# Patient Record
Sex: Male | Born: 1980
Health system: Southern US, Community
[De-identification: ages and names within clinical notes are randomized; demographics above are authoritative.]

## PROBLEM LIST (undated history)

## (undated) DIAGNOSIS — R42 Dizziness and giddiness: Secondary | ICD-10-CM

## (undated) DIAGNOSIS — K219 Gastro-esophageal reflux disease without esophagitis: Secondary | ICD-10-CM

---

## 2014-02-21 ENCOUNTER — Encounter (HOSPITAL_COMMUNITY): Payer: Self-pay | Admitting: Family Medicine

## 2014-02-21 ENCOUNTER — Emergency Department (INDEPENDENT_AMBULATORY_CARE_PROVIDER_SITE_OTHER)
Admission: EM | Admit: 2014-02-21 | Discharge: 2014-02-21 | Disposition: A | Discharge status: Home / Self Care | Payer: Worker's Compensation | Source: Home / Self Care | Attending: Family Medicine | Admitting: Family Medicine

## 2014-02-21 DIAGNOSIS — S0191XA Laceration without foreign body of unspecified part of head, initial encounter: Secondary | ICD-10-CM

## 2014-02-21 DIAGNOSIS — Y9269 Other specified industrial and construction area as the place of occurrence of the external cause: Secondary | ICD-10-CM

## 2014-02-21 DIAGNOSIS — S0190XA Unspecified open wound of unspecified part of head, initial encounter: Secondary | ICD-10-CM

## 2014-02-21 DIAGNOSIS — IMO0002 Reserved for concepts with insufficient information to code with codable children: Secondary | ICD-10-CM

## 2014-02-21 NOTE — ED Provider Notes (Signed)
CSN: [id number]     Arrival date & time 02/21/14  1825 History   First MD Initiated Contact with Patient 02/21/14 1928     No chief complaint on file.  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 Immediately painful and bleeding. Applied ointment from first aid kit and stopped the bleeding. Last tetanus in 2010. Sensation still intact. Denies dizziness, HA, syncope, lightheadedness.   History reviewed. No pertinent past medical history. History reviewed. No pertinent past surgical history. Family History  Problem Relation Age of Onset  . Cancer Mother     colon   History  Substance Use Topics  . Smoking status: Never Smoker   . Smokeless tobacco: Not on file  . Alcohol Use: No    Review of Systems Per HPI with all other pertinent systems negative.   Allergies  Review of patient's allergies indicates not on file.  Home Medications   Prior to Admission medications   Not on File   BP 123/75  Pulse 51  Temp(Src) 98 F (36.7 C) (Oral)  Resp 16  SpO2 98% Physical Exam  Constitutional: He is oriented to person, place, and time. He appears well-developed and well-nourished.  HENT:  Head: Normocephalic.  Mouth/Throat: Oropharynx is clear and moist.  Eyes: Pupils are equal, round, and reactive to light.  Neck: Normal range of motion.  Pulmonary/Chest: Effort normal. No respiratory distress.  Musculoskeletal: Normal range of motion. He exhibits no edema and no tenderness.  Neurological: He is alert and oriented to person, place, and time. No cranial nerve deficit. He exhibits normal muscle tone. Coordination normal.  Skin: Skin is warm and dry. No rash noted. No erythema. No pallor.  3.75cm linear epidermal abrasion. No active bleeding. No surounding induration or ttp. No contusion    Psychiatric: He has a normal mood and affect. His behavior is normal. Judgment and thought content normal.    ED Course  Procedures (including critical care time) Labs Review Labs Reviewed - No data to display  Imaging Review No results found.   MDM   1. Laceration of head, initial encounter    Superficial laceration or skin abrasion. No need for suturing or other repair.  Bacitracin ointment BID  Until scab forms No sign of intracranial injury or concussion Tetanus is UTD Precautions given and all questions answered  Linna Darner, MD Family Medicine 02/21/2014, 7:52 PM      Waldemar Dickens, MD 02/21/14 [phone]

## 2014-02-21 NOTE — Discharge Instructions (Signed)
You have a superficial laceration of your scalp that measures 3.75 cm. This will heal quickly Please apply bacitracin ointment to your head twice a day until a scab forms Please come back if you develop any symptoms of infection

## 2014-06-10 ENCOUNTER — Emergency Department (HOSPITAL_COMMUNITY): Payer: Self-pay

## 2014-06-10 ENCOUNTER — Emergency Department (HOSPITAL_COMMUNITY)
Admission: EM | Admit: 2014-06-10 | Discharge: 2014-06-10 | Disposition: A | Discharge status: Home / Self Care | Payer: Self-pay | Attending: Emergency Medicine | Admitting: Emergency Medicine

## 2014-06-10 ENCOUNTER — Encounter (HOSPITAL_COMMUNITY): Payer: Self-pay | Admitting: Emergency Medicine

## 2014-06-10 DIAGNOSIS — R630 Anorexia: Secondary | ICD-10-CM | POA: Insufficient documentation

## 2014-06-10 DIAGNOSIS — R109 Unspecified abdominal pain: Secondary | ICD-10-CM | POA: Insufficient documentation

## 2014-06-10 DIAGNOSIS — Z79899 Other long term (current) drug therapy: Secondary | ICD-10-CM | POA: Insufficient documentation

## 2014-06-10 DIAGNOSIS — R5383 Other fatigue: Secondary | ICD-10-CM

## 2014-06-10 DIAGNOSIS — R5381 Other malaise: Secondary | ICD-10-CM | POA: Insufficient documentation

## 2014-06-10 LAB — CBC WITH DIFFERENTIAL/PLATELET
BASOS ABS: 0 10*3/uL (ref 0.0–0.1)
BASOS PCT: 0 % (ref 0–1)
EOS ABS: 0.1 10*3/uL (ref 0.0–0.7)
Eosinophils Relative: 1 % (ref 0–5)
HEMATOCRIT: 38.9 % — AB (ref 39.0–52.0)
Hemoglobin: 14.1 g/dL (ref 13.0–17.0)
Lymphocytes Relative: 32 % (ref 12–46)
Lymphs Abs: 3.2 10*3/uL (ref 0.7–4.0)
MCH: 29.5 pg (ref 26.0–34.0)
MCHC: 36.2 g/dL — AB (ref 30.0–36.0)
MCV: 81.4 fL (ref 78.0–100.0)
MONO ABS: 0.6 10*3/uL (ref 0.1–1.0)
Monocytes Relative: 6 % (ref 3–12)
Neutro Abs: 6.1 10*3/uL (ref 1.7–7.7)
Neutrophils Relative %: 61 % (ref 43–77)
Platelets: 298 10*3/uL (ref 150–400)
RBC: 4.78 MIL/uL (ref 4.22–5.81)
RDW: 16.1 % — AB (ref 11.5–15.5)
WBC: 10.1 10*3/uL (ref 4.0–10.5)

## 2014-06-10 LAB — COMPREHENSIVE METABOLIC PANEL
ALBUMIN: 4.2 g/dL (ref 3.5–5.2)
ALT: 24 U/L (ref 0–53)
ANION GAP: 13 (ref 5–15)
AST: 28 U/L (ref 0–37)
Alkaline Phosphatase: 57 U/L (ref 39–117)
BILIRUBIN TOTAL: 0.9 mg/dL (ref 0.3–1.2)
BUN: 14 mg/dL (ref 6–23)
CALCIUM: 9.9 mg/dL (ref 8.4–10.5)
CO2: 25 mEq/L (ref 19–32)
CREATININE: 0.9 mg/dL (ref 0.50–1.35)
Chloride: 100 mEq/L (ref 96–112)
GFR calc Af Amer: >90 mL/min (ref >90)
GFR calc non Af Amer: >90 mL/min (ref >90)
Glucose, Bld: 131 mg/dL — ABNORMAL HIGH (ref 70–99)
Potassium: 4.1 mEq/L (ref 3.7–5.3)
Sodium: 138 mEq/L (ref 137–147)
TOTAL PROTEIN: 8.2 g/dL (ref 6.0–8.3)

## 2014-06-10 LAB — URINALYSIS, ROUTINE W REFLEX MICROSCOPIC
Bilirubin Urine: NEGATIVE
Glucose, UA: NEGATIVE mg/dL
HGB URINE DIPSTICK: NEGATIVE
Ketones, ur: NEGATIVE mg/dL
Leukocytes, UA: NEGATIVE
Nitrite: NEGATIVE
Protein, ur: NEGATIVE mg/dL
Specific Gravity, Urine: 1.013 (ref 1.005–1.030)
UROBILINOGEN UA: 0.2 mg/dL (ref 0.0–1.0)
pH: 6.5 (ref 5.0–8.0)

## 2014-06-10 LAB — CBG MONITORING, ED: Glucose-Capillary: 146 mg/dL — ABNORMAL HIGH (ref 70–99)

## 2014-06-10 LAB — LIPASE, BLOOD: Lipase: 55 U/L (ref 11–59)

## 2014-06-10 MED ORDER — ACETAMINOPHEN 500 MG PO TABS: 500.0000 mg | ORAL_TABLET | Freq: Four times a day (QID) | ORAL | Status: DC | PRN

## 2014-06-10 NOTE — ED Provider Notes (Signed)
CSN: [id number]     Arrival date & time 06/10/14  1631 History   First MD Initiated Contact with Patient 06/10/14 1714     Chief Complaint  Patient presents with  . Flank Pain    left  . Abdominal Pain  . Fatigue     (Consider location/radiation/quality/duration/timing/severity/associated sxs/prior Treatment) HPI Patient has a constellation of symptoms that started about a month ago. He reports generalized fatigue. He denies fever. He reports decreased appetite. He denies abdominal pain. He does however note that there is a spot on the left side of his abdomen that he sometimes feels protrudes somewhat. He also notes that sometimes he has pain in the left flank that he qualifies as being sharp. It comes and goes. It is not currently present. The patient reports that sometimes after having a bowel movement he wipes and he notes some red blood on the tissue. He denies he's having diarrhea or constipation. The patient does not have vomiting. He reports sometimes he gets a sharp pain up behind his right shoulder blade. That may come and go. He does not have associated shortness of breath or cough. Is not present currently. He reports that he is often very thirsty and that he thinks he is urinating more than usual although does not have any pain or burning with urination. Patient denies skin rash. He denies joint pain or swelling. No headache no incoordination no visual changes. History reviewed. No pertinent past medical history. History reviewed. No pertinent past surgical history. Family History  Problem Relation Age of Onset  . Cancer Mother     colon   History  Substance Use Topics  . Smoking status: Never Smoker   . Smokeless tobacco: Not on file  . Alcohol Use: No    Review of Systems 10 Systems reviewed and are negative for acute change except as noted in the HPI.    Allergies  Review of patient's allergies indicates no known allergies.  Home Medications   Prior to Admission  medications   Medication Sig Start Date End Date Taking? Authorizing Provider  NON FORMULARY Take 574 mg by mouth 2 (two) times daily. Amoxicillin [place].   Yes Historical Provider, MD  Omega-3 Fatty Acids (OMEGA 3 PO) Take 1 capsule by mouth daily.   Yes Historical Provider, MD  acetaminophen (TYLENOL) 500 MG tablet Take 1 tablet (500 mg total) by mouth every 6 (six) hours as needed. 06/10/14   Arby BarretteMarcy Cardarius Senat, MD   BP 121/65 mmHg  Pulse 61  Temp(Src) 98.7 F (37.1 C) (Oral)  Resp 17  SpO2 98% Physical Exam  Constitutional: He is oriented to person, place, and time. He appears well-developed and well-nourished.  The patient is very well conditioned and has well formed musculature. He is well in appearance.  HENT:  Head: Normocephalic and atraumatic.  Right Ear: External ear normal.  Left Ear: External ear normal.  Nose: Nose normal.  Mouth/Throat: Oropharynx is clear and moist. No oropharyngeal exudate.  Eyes: EOM are normal. Pupils are equal, round, and reactive to light. Right eye exhibits no discharge. Left eye exhibits no discharge. No scleral icterus.  Neck: Neck supple. No thyromegaly present.  Cardiovascular: Normal rate, regular rhythm, normal heart sounds and intact distal pulses.   Pulmonary/Chest: Effort normal and breath sounds normal. No respiratory distress. He has no wheezes. He has no rales.  Abdominal: Soft. Bowel sounds are normal. He exhibits no distension and no mass. There is no tenderness. There is no rebound  and no guarding.  There is no appreciable mass or protrusion on the left side of the abdomen more the patient notes it. He has no hepatosplenomegaly.  Musculoskeletal: Normal range of motion. He exhibits no edema or tenderness.  No joint swelling or tenderness.  Lymphadenopathy:    He has no cervical adenopathy.  Neurological: He is alert and oriented to person, place, and time. He has normal strength. No cranial nerve deficit. Coordination normal. GCS  eye subscore is 4. GCS verbal subscore is 5. GCS motor subscore is 6.  Skin: Skin is warm, dry and intact. No rash noted. No erythema.  Psychiatric: He has a normal mood and affect.    ED Course  Procedures (including critical care time) Labs Review Labs Reviewed  CBC WITH DIFFERENTIAL - Abnormal; Notable for the following:    HCT 38.9 (*)    MCHC 36.2 (*)    RDW 16.1 (*)    All other components within normal limits  COMPREHENSIVE METABOLIC PANEL - Abnormal; Notable for the following:    Glucose, Bld 131 (*)    All other components within normal limits  URINALYSIS, ROUTINE W REFLEX MICROSCOPIC - Abnormal; Notable for the following:    APPearance CLOUDY (*)    All other components within normal limits  CBG MONITORING, ED - Abnormal; Notable for the following:    Glucose-Capillary 146 (*)    All other components within normal limits  LIPASE, BLOOD    Imaging Review Dg Chest 2 View  06/10/2014   CLINICAL DATA:  Left posterior side/back pain  EXAM: CHEST  2 VIEW  COMPARISON:  None.  FINDINGS: Lungs are clear.  No pleural effusion or pneumothorax.  The heart is normal in size.  Visualized osseous structures are within normal limits.  IMPRESSION: No evidence of acute cardiopulmonary disease.   Electronically Signed   By: Charline BillsSriyesh  Krishnan M.D.   On: 06/10/2014 18:17     EKG Interpretation None      MDM   Final diagnoses:  Malaise and fatigue  Anorexia  Flank pain    The patient has normal physical examination with stable vital signs. Diagnostic studies do not indicate an etiology for his symptoms. At this point he is in stable condition for further outpatient management with a family physician. He is given instructions for signs and symptoms for which to return and the resource guide to find a family doctor.    Arby BarretteMarcy Amyrie Illingworth, MD 06/10/14 [phone]

## 2014-06-10 NOTE — Discharge Instructions (Signed)
Flank Pain Flank pain refers to pain that is located on the side of the body between the upper abdomen and the back. The pain may occur over a short period of time (acute) or may be long-term or reoccurring (chronic). It may be mild or severe. Flank pain can be caused by many things. CAUSES  Some of the more common causes of flank pain include:  Muscle strains.   Muscle spasms.   A disease of your spine (vertebral disk disease).   A lung infection (pneumonia).   Fluid around your lungs (pulmonary edema).   A kidney infection.   Kidney stones.   A very painful skin rash caused by the chickenpox virus (shingles).   Gallbladder disease.  HOME CARE INSTRUCTIONS  Home care will depend on the cause of your pain. In general,  Rest as directed by your caregiver.  Drink enough fluids to keep your urine clear or pale yellow.  Only take over-the-counter or prescription medicines as directed by your caregiver. Some medicines may help relieve the pain.  Tell your caregiver about any changes in your pain.  Follow up with your caregiver as directed. SEEK IMMEDIATE MEDICAL CARE IF:   Your pain is not controlled with medicine.   You have new or worsening symptoms.  Your pain increases.   You have abdominal pain.   You have shortness of breath.   You have persistent nausea or vomiting.   You have swelling in your abdomen.   You feel faint or pass out.   You have blood in your urine.  You have a fever or persistent symptoms for more than 2-3 days.  You have a fever and your symptoms suddenly get worse. MAKE SURE YOU:   Understand these instructions.  Will watch your condition.  Will get help right away if you are not doing well or get worse. Document Released: 08/15/2005 Document Revised: 03/18/2012 Document Reviewed: 02/06/2012 [facility] Patient Information 2015 [place], [place]. This information is not intended to replace advice given to you by your  health care provider. Make sure you discuss any questions you have with your health care provider. Weakness Weakness is a lack of strength. It may be felt all over the body (generalized) or in one specific part of the body (focal). Some causes of weakness can be serious. You may need further medical evaluation, especially if you are elderly or you have a history of immunosuppression (such as chemotherapy or HIV), kidney disease, heart disease, or diabetes. CAUSES  Weakness can be caused by many different things, including:  Infection.  Physical exhaustion.  Internal bleeding or other blood loss that results in a lack of red blood cells (anemia).  Dehydration. This cause is more common in elderly people.  Side effects or electrolyte abnormalities from medicines, such as pain medicines or sedatives.  Emotional distress, anxiety, or depression.  Circulation problems, especially severe peripheral arterial disease.  Heart disease, such as rapid atrial fibrillation, bradycardia, or heart failure.  Nervous system disorders, such as Guillain-Barr syndrome, multiple sclerosis, or stroke. DIAGNOSIS  To find the cause of your weakness, your caregiver will take your history and perform a physical exam. Lab tests or X-rays may also be ordered, if needed. TREATMENT  Treatment of weakness depends on the cause of your symptoms and can vary greatly. HOME CARE INSTRUCTIONS   Rest as needed.  Eat a well-balanced diet.  Try to get some exercise every day.  Only take over-the-counter or prescription medicines as directed by your caregiver.  SEEK MEDICAL CARE IF:   Your weakness seems to be getting worse or spreads to other parts of your body.  You develop new aches or pains. SEEK IMMEDIATE MEDICAL CARE IF:   You cannot perform your normal daily activities, such as getting dressed and feeding yourself.  You cannot walk up and down stairs, or you feel exhausted when you do so.  You have  shortness of breath or chest pain.  You have difficulty moving parts of your body.  You have weakness in only one area of the body or on only one side of the body.  You have a fever.  You have trouble speaking or swallowing.  You cannot control your bladder or bowel movements.  You have black or bloody vomit or stools. MAKE SURE YOU:  Understand these instructions.  Will watch your condition.  Will get help right away if you are not doing well or get worse. Document Released: 06/24/2005 Document Revised: 12/24/2011 Document Reviewed: 08/23/2011 [facility] Patient Information 2015 [place], [place]. This information is not intended to replace advice given to you by your health care provider. Make sure you discuss any questions you have with your health care provider.  Emergency Department Resource Guide 1) Find a Doctor and Pay Out of Pocket Although you won't have to find out who is covered by your insurance plan, it is a good idea to ask around and get recommendations. You will then need to call the office and see if the doctor you have chosen will accept you as a new patient and what types of options they offer for patients who are self-pay. Some doctors offer discounts or will set up payment plans for their patients who do not have insurance, but you will need to ask so you aren't surprised when you get to your appointment.  2) Contact Your Local Health Department Not all health departments have doctors that can see patients for sick visits, but many do, so it is worth a call to see if yours does. If you don't know where your local health department is, you can check in your phone book. The CDC also has a tool to help you locate your state's health department, and many state websites also have listings of all of their local health departments.  3) Find a Walk-in Clinic If your illness is not likely to be very severe or complicated, you may want to try a walk in clinic. These are popping  up all over the country in pharmacies, drugstores, and shopping centers. They're usually staffed by nurse practitioners or physician assistants that have been trained to treat common illnesses and complaints. They're usually fairly quick and inexpensive. However, if you have serious medical issues or chronic medical problems, these are probably not your best option.  No Primary Care Doctor: - Call Health Connect at  [phone] - they can help you locate a primary care doctor that  accepts your insurance, provides certain services, etc. - Physician Referral Service- [phone]  Chronic Pain Problems: Organization         Address  Phone   Notes  [facility]  [phone] Patients need to be referred by their primary care doctor.   Medication Assistance: Organization         Address  Phone   Notes  [facility] Medication [facility] [address] [place], [place] [address] [phone] --Must be a resident of [facility] -- Must have NO insurance coverage whatsoever (  no Medicaid/ Medicare, etc.) -- The pt. MUST have a primary care doctor that directs their care regularly and follows them in the community   [facility]  [phone]   [employer]  [phone]    Agencies that provide inexpensive medical care: Organization         Address  Phone   Notes  [facility]  [phone]   [facility]    [phone]   [facility] [address] [place], [place] [address] [phone]   [facility] [address], [place] [phone]   Planned Parenthood    [phone]   [facility]    [phone]   Community Health and [facility]  [address], [place] Phone:  [phone], Fax:  [phone] Hours of Operation:  9 am - 6 pm, M-F.  Also accepts Medicaid/Medicare and self-pay.  [facility]  [address], [place] Phone: [phone], Fax: [phone]. Hours of Operation:  8:30 am - 5:30 pm, M-F.  Also accepts Medicaid and self-pay.  [facility] [place] [address], [place] Phone: [phone]   [facility] [address] Natasha Bence [place], [place] [phone], Ext. [phone] Mondays & Thursdays: 7-9 AM.  First 15 patients are seen on a first come, first serve basis.    Medicaid-accepting [facility] Providers:  Organization         Address  Phone   Notes  [facility] [address], [place] [phone] Also accepts self-pay patients.  [facility] [address] Laurell Josephs [place], [place]  [phone]   [facility] [address], [place] [phone]   [facility] [address] [place] [address], [place] [phone]   Renaye Rakers [address], [place]   [phone] Only accepts [place] Access [place] patients after they have their name applied to their card.   Self-Pay (no insurance) in [facility]:  Organization         Address  Phone   Notes  Sickle Cell Patients, [facility] [address] [place], [place] [phone]   [facility] [address] [place], [place] [phone]   [facility]  [address], [place] [phone]   [facility]/Dr. Osei-Bonsu  [address] [place] or [address], [place] [phone] Phone number for both [place] and [place] locations is the same.  [facility]gent Medical and [facility] [address], [place] [phone]   [facility] [address] [place] or [address] [phone]   [facility] [address] [place] [phone], phone; [phone], fax Sees patients 1st and 3rd Saturday of  every month.  Must not qualify for public or private insurance (i.e. Medicaid, Medicare, [place] Health Choice, Veterans' Benefits)  Household income should be no more than 200% of the poverty level The clinic cannot treat you if you are pregnant or think you are pregnant  Sexually transmitted diseases are not treated at the clinic.    Dental Care: Organization         Address  Phone  Notes  [facility] Public  Health [facility] [address], [place] [phone] Accepts children up to age 721 who are enrolled in [place] or [place] Health Choice; pregnant women with a Medicaid card; and children who have applied for Medicaid or [place] Health Choice, but were declined, whose parents can pay a reduced fee at time of service.  [facility] [place]  [address], [place] [phone] Accepts children up to age 33 who are enrolled in [place] or [place] Health Choice; pregnant women with a Medicaid card; and children who have applied for Medicaid or [place] Health Choice, but were declined, whose parents can pay a reduced fee at time of service.  [facility]  [address], [place] [phone] Patients are seen by appointment only. Walk-ins are not accepted. [facility] will see patients 33 years of age and older. Monday - Tuesday (8am-5pm) Most Wednesdays (8:30-5pm) $30 per visit, cash only  [facility]  [address], [facility] [phone] Patients are seen by appointment only. Walk-ins are not accepted. [facility] will see patients 33 years of age and older. One Wednesday Evening (Monthly: Volunteer Based).  $30 per visit, cash only  [employer] [facility]  [phone] for adults; Children under age 684, call Graduate Pediatric Dentistry at [phone]. Children aged 724-14, please call [phone] to request a pediatric application.  Dental  services are provided in all areas of dental care including fillings, crowns and bridges, complete and partial dentures, implants, gum treatment, root canals, and extractions. Preventive care is also provided. Treatment is provided to both adults and children. Patients are selected via a lottery and there is often a waiting list.   [facility] [address], [place]  [phone] [email or web address]   [facility] [address], [place], [place] [phone], Ext. [phone] Second and Fourth Thursday of each month, opens at 6:30 AM; Clinic ends at 9 AM.  Patients are seen on a first-come first-served basis, and a limited number are seen during each clinic.   [facility]  [address], [place], [place] [phone]   Eligibility Requirements You must have lived in [place], [place], or [place] counties for at least the last three months.   You cannot be eligible for state or federal sponsored [place] Cityhealthcare insurance, including CIGNAVeterans Administration, [place]icaid, or [employer]icare.   You generally cannot be eligible for healthcare insurance through your employer.    How to apply: Eligibility screenings are held every Tuesday and Wednesday afternoon from 1:00 pm until 4:00 pm. You do not need an appointment for the interview!  [facility] [address], [place], [place] [phone]   [facility]  [phone]   [facility]  [phone]   [facility]  [phone]    Behavioral Health Resources in the Community: Intensive Outpatient Programs Organization         Address  Phone  Notes  [facility] [address], [place], [place] [phone]   [facility] [address], [place], [place] [phone]   [facility] [address], [place], [place]  [phone]   [facility] [address],    [place], [place] [phone] or [phone]   Substance Abuse Resources Organization         Address  Phone  Notes  [facility]  [phone]   [facility]  [phone]   [facility] [place]  [phone]   Floydene Flock  [phone]   Residential & Outpatient Substance Abuse Program  [phone]   Psychological Services Organization         Address  Phone  Notes  [facility]  [phone]   [facility] Services  [phone]   [facility] [address], [place] [phone] or [phone]    Mobile Crisis Teams Organization         Address  Phone  Notes  [facility], Mobile Crisis Care Unit  [phone]   [facility]  [address] [place], [place] [phone]   Doristine Locks [address] [place] [phone]    Self-Help/Support Groups Organization         Address  Phone             Notes  [facility] [place] - variety of support groups  336- I7437963 Call for more information  Narcotics Anonymous (NA), [facility] [address], [employer] [place]  2 meetings at this location   Statistician         Address  Phone  Notes  [facility] [address],    [place]  [phone]   [facility]  [address] [place] [address], [place], [place] [phone]   [facility] [address] [place], [place] [phone] Admissions: 8am-3pm M-F  [facility] [address]    [place], [place] [phone]   [facility] [address] [place], [place], [place] [phone]   [facility] [address],  [place], [place] [phone]   [facility] - Intensive Outpatient [address] Laurell Josephs [address], [place], [place] [phone]   [facility] ([facility]iction [facility] [address] [place].,  [place], [place]  [phone] or [phone]   [facility] ([facility]) [address] [place], [place] [phone] Accepts Medicaid  [place] [address],  [place] [phone] Substance Abuse/Addiction Treatment   [facility] Organization         Address  Phone  Notes  [facility]  [phone]   Angie Fava, PhD [address] Ervin Knack [place], [place]   [phone] or [phone]   [facility]   [address] [place], [place] [phone]   [facility] [address] [place], [place] [phone] Insurance/Medicaid/sponsorship through [facility] and Families [address]                                    [place], [place] [phone] Therapy/tele-psych/case  [facility] [address] [place], [place] [phone]    Dr. Lolly Mustache  [phone]   [facility]  [employer] [facility] Dept. 1) [address] [place] 2) [address] [place] 3)  [address], [place] [phone]  [phone]   [facility] Child Abuse Hotline 534-105-1852 or 5741650545 (After Hours)

## 2014-06-10 NOTE — ED Notes (Addendum)
Pt states that for about a month he has been having blood in stools intermittently, left flank pain that radiates around to abd, fatigue and loss of appetite.  Pt states that when he ws having the blood in stool he changed his diet to more vegies and hasnt had any lately.   Pt added that he has excessive thirst and increase in urine frequency.

## 2014-06-17 ENCOUNTER — Ambulatory Visit: Payer: Self-pay

## 2014-06-18 ENCOUNTER — Emergency Department (HOSPITAL_COMMUNITY)
Admission: EM | Admit: 2014-06-18 | Discharge: 2014-06-18 | Disposition: A | Discharge status: Home / Self Care | Payer: No Typology Code available for payment source | Attending: Emergency Medicine | Admitting: Emergency Medicine

## 2014-06-18 ENCOUNTER — Encounter (HOSPITAL_COMMUNITY): Payer: Self-pay | Admitting: Emergency Medicine

## 2014-06-18 DIAGNOSIS — R5383 Other fatigue: Secondary | ICD-10-CM | POA: Insufficient documentation

## 2014-06-18 DIAGNOSIS — R630 Anorexia: Secondary | ICD-10-CM | POA: Diagnosis not present

## 2014-06-18 DIAGNOSIS — K921 Melena: Secondary | ICD-10-CM | POA: Diagnosis not present

## 2014-06-18 DIAGNOSIS — R42 Dizziness and giddiness: Secondary | ICD-10-CM | POA: Diagnosis not present

## 2014-06-18 DIAGNOSIS — Z79899 Other long term (current) drug therapy: Secondary | ICD-10-CM | POA: Insufficient documentation

## 2014-06-18 DIAGNOSIS — K625 Hemorrhage of anus and rectum: Secondary | ICD-10-CM | POA: Diagnosis present

## 2014-06-18 LAB — CBC WITH DIFFERENTIAL/PLATELET
BASOS PCT: 0 % (ref 0–1)
Basophils Absolute: 0 10*3/uL (ref 0.0–0.1)
EOS ABS: 0.2 10*3/uL (ref 0.0–0.7)
Eosinophils Relative: 2 % (ref 0–5)
HCT: 37 % — ABNORMAL LOW (ref 39.0–52.0)
HEMOGLOBIN: 12.9 g/dL — AB (ref 13.0–17.0)
Lymphocytes Relative: 45 % (ref 12–46)
Lymphs Abs: 4.2 10*3/uL — ABNORMAL HIGH (ref 0.7–4.0)
MCH: 28.7 pg (ref 26.0–34.0)
MCHC: 34.9 g/dL (ref 30.0–36.0)
MCV: 82.4 fL (ref 78.0–100.0)
MONO ABS: 0.9 10*3/uL (ref 0.1–1.0)
Monocytes Relative: 9 % (ref 3–12)
NEUTROS PCT: 44 % (ref 43–77)
Neutro Abs: 4.2 10*3/uL (ref 1.7–7.7)
Platelets: 260 10*3/uL (ref 150–400)
RBC: 4.49 MIL/uL (ref 4.22–5.81)
RDW: 16.4 % — ABNORMAL HIGH (ref 11.5–15.5)
WBC: 9.5 10*3/uL (ref 4.0–10.5)

## 2014-06-18 LAB — URINALYSIS, ROUTINE W REFLEX MICROSCOPIC
Bilirubin Urine: NEGATIVE
Glucose, UA: NEGATIVE mg/dL
Hgb urine dipstick: NEGATIVE
Ketones, ur: NEGATIVE mg/dL
Leukocytes, UA: NEGATIVE
NITRITE: NEGATIVE
Protein, ur: NEGATIVE mg/dL
SPECIFIC GRAVITY, URINE: 1.014 (ref 1.005–1.030)
UROBILINOGEN UA: 0.2 mg/dL (ref 0.0–1.0)
pH: 7 (ref 5.0–8.0)

## 2014-06-18 LAB — CBG MONITORING, ED: Glucose-Capillary: 111 mg/dL — ABNORMAL HIGH (ref 70–99)

## 2014-06-18 LAB — POC OCCULT BLOOD, ED: FECAL OCCULT BLD: NEGATIVE

## 2014-06-18 LAB — COMPREHENSIVE METABOLIC PANEL
ALBUMIN: 4.2 g/dL (ref 3.5–5.2)
ALT: 33 U/L (ref 0–53)
AST: 38 U/L — ABNORMAL HIGH (ref 0–37)
Alkaline Phosphatase: 55 U/L (ref 39–117)
Anion gap: 15 (ref 5–15)
BUN: 27 mg/dL — ABNORMAL HIGH (ref 6–23)
CO2: 25 mEq/L (ref 19–32)
CREATININE: 1.26 mg/dL (ref 0.50–1.35)
Calcium: 9.5 mg/dL (ref 8.4–10.5)
Chloride: 99 mEq/L (ref 96–112)
GFR calc Af Amer: 85 mL/min — ABNORMAL LOW (ref >90)
GFR calc non Af Amer: 74 mL/min — ABNORMAL LOW (ref >90)
Glucose, Bld: 103 mg/dL — ABNORMAL HIGH (ref 70–99)
POTASSIUM: 4.1 meq/L (ref 3.7–5.3)
Sodium: 139 mEq/L (ref 137–147)
TOTAL PROTEIN: 7.9 g/dL (ref 6.0–8.3)
Total Bilirubin: 0.6 mg/dL (ref 0.3–1.2)

## 2014-06-18 LAB — HIV ANTIBODY (ROUTINE TESTING W REFLEX): HIV: NONREACTIVE

## 2014-06-18 LAB — LIPASE, BLOOD: LIPASE: 38 U/L (ref 11–59)

## 2014-06-18 MED ORDER — POLYETHYLENE GLYCOL 3350 17 GM/SCOOP PO POWD: 17.0000 g | Freq: Two times a day (BID) | ORAL | Status: DC

## 2014-06-18 NOTE — ED Notes (Signed)
Pt reports for the past two weeks he has been having bloody stools, initially "bright red streaks" but now states "it's all dark red." Pt reports that he has abdominal distention, is pale, and has loss weight. Pt reports that he is able to eat without nausea, but has decreased appetite. Pt a&ox4, skin warm and dry. Ambulatory to triage.

## 2014-06-18 NOTE — ED Provider Notes (Signed)
CSN: [id number]     Arrival date & time 06/18/14  0034 History   First MD Initiated Contact with Patient 06/18/14 0308     Chief Complaint  Patient presents with  . Fatigue  . Rectal Bleeding    (Consider location/radiation/quality/duration/timing/severity/associated sxs/prior Treatment) HPI Comments: Patient is a 33 year old male with no significant past medical history who presents to the emergency department for further evaluation of fatigue and rectal bleeding. Patient states that his fatigue has been persistent over the past 2 weeks. He states that symptoms wax and wane without modifying factors. He has noticed that he has had some bright red blood in his stools and on the toilet tissue intermittently as well. He states he took some Pepto-Bismol to try and alleviate these symptoms. Patient also describes progressive loss of appetite as well as weight loss; however, he does state that he his weight today is 10 pounds heavier than his baseline. He states he has felt intermittently lightheaded as well. He has not followed up with a primary care doctor regarding his symptoms and states that he was evaluated for the symptoms in the ED 1 week prior. He reports no change in his symptoms since this evaluation. Patient denies associated fever, recent travel, chest pain, syncope or near-syncope, vomiting, dysuria, penile discharge, penile swelling, melena, headache, or history of abdominal surgeries. He states he had a normal bowel movement today. He is up-to-date on his immunizations. He has had 2 sexual partners in the last 6 months and reports intermittently using condoms. He denies concern for STDs.  The history is provided by the patient. No language interpreter was used.    History reviewed. No pertinent past medical history. History reviewed. No pertinent past surgical history. Family History  Problem Relation Age of Onset  . Cancer Mother     colon   History  Substance Use Topics  .  Smoking status: Never Smoker   . Smokeless tobacco: Never Used  . Alcohol Use: No    Review of Systems  Constitutional: Positive for appetite change and fatigue. Negative for fever.  Respiratory: Negative for shortness of breath.   Cardiovascular: Negative for chest pain.  Gastrointestinal: Positive for blood in stool. Negative for nausea, vomiting, abdominal pain and diarrhea.  Genitourinary: Negative for discharge, penile swelling, scrotal swelling and penile pain.  Neurological: Positive for light-headedness. Negative for syncope.  All other systems reviewed and are negative.   Allergies  Review of patient's allergies indicates no known allergies.  Home Medications   Prior to Admission medications   Medication Sig Start Date End Date Taking? Authorizing Provider  acetaminophen (TYLENOL) 500 MG tablet Take 1 tablet (500 mg total) by mouth every 6 (six) hours as needed. 06/10/14  Yes Arby BarretteMarcy Pfeiffer, MD  famotidine (PEPCID) 10 MG tablet Take 10 mg by mouth 2 (two) times daily as needed for heartburn.    Yes Historical Provider, MD  Omega-3 Fatty Acids (OMEGA 3 PO) Take 1 capsule by mouth daily.   Yes Historical Provider, MD  NON FORMULARY Take 574 mg by mouth 2 (two) times daily. Amoxicillin [place].    Historical Provider, MD  polyethylene glycol powder (GLYCOLAX/MIRALAX) powder Take 17 g by mouth 2 (two) times daily. Until daily soft stools  OTC 06/18/14   Antony MaduraKelly Umar Patmon, PA-C   BP 96/68 mmHg  Pulse 54  Temp(Src) 97.8 F (36.6 C) (Oral)  Resp 16  Ht 5\' 7"  (1.702 m)  Wt 160 lb (72.576 kg)  BMI 25.05 kg/m2  SpO2 99%   Physical Exam  Constitutional: He is oriented to person, place, and time. He appears well-developed and well-nourished. No distress.  Nontoxic/nonseptic appearing  HENT:  Head: Normocephalic and atraumatic.  Eyes: Conjunctivae and EOM are normal. No scleral icterus.  Neck: Normal range of motion.  Cardiovascular: Normal rate, regular rhythm and normal  heart sounds.   Pulmonary/Chest: Effort normal and breath sounds normal. No respiratory distress. He has no wheezes. He has no rales.  Lungs clear bilaterally. Respirations even and unlabored.  Abdominal: Soft. He exhibits no distension. There is no tenderness. There is no rebound and no guarding.  Soft, nontender  Genitourinary:  Chaperoned GU exam with normal rectal tone. No gross blood per rectum. No melanotic stool.  Musculoskeletal: Normal range of motion.  Neurological: He is alert and oriented to person, place, and time. He exhibits normal muscle tone. Coordination normal.  GCS 15. Speech is goal oriented. Patient moves extremities without ataxia.  Skin: Skin is warm and dry. No rash noted. He is not diaphoretic. No erythema. No pallor.  Psychiatric: He has a normal mood and affect. His behavior is normal.  Nursing note and vitals reviewed.   ED Course  Procedures (including critical care time) Labs Review Labs Reviewed  CBC WITH DIFFERENTIAL - Abnormal; Notable for the following:    Hemoglobin 12.9 (*)    HCT 37.0 (*)    RDW 16.4 (*)    Lymphs Abs 4.2 (*)    All other components within normal limits  COMPREHENSIVE METABOLIC PANEL - Abnormal; Notable for the following:    Glucose, Bld 103 (*)    BUN 27 (*)    AST 38 (*)    GFR calc non Af Amer 74 (*)    GFR calc Af Amer 85 (*)    All other components within normal limits  CBG MONITORING, ED - Abnormal; Notable for the following:    Glucose-Capillary 111 (*)    All other components within normal limits  GC/CHLAMYDIA PROBE AMP  LIPASE, BLOOD  URINALYSIS, ROUTINE W REFLEX MICROSCOPIC  HIV ANTIBODY (ROUTINE TESTING)  POC OCCULT BLOOD, ED    Imaging Review No results found.   EKG Interpretation None      MDM   Final diagnoses:  Other fatigue  Hematochezia    33 year old male presents to the emergency department for multiple vague complaints which have been persistent over the past 2 weeks. Patient was  evaluated in the ED for these complaints one week prior to presentation this evening. Patient has a reassuring physical examination today. Vital signs have been stable over ED course and laboratory workup is also stable compared to prior. Patient has a negative Hemoccult today. Have added HIV as well as gonorrhea/chlamydia for further evaluation of fatigue. Suspect that hematochezia is secondary to an internal hemorrhoid or anal fissure. Workup does not suggest acute loss today. Will also prescribe MiraLAX to soften stool. Will refer patient to gastroenterology for further evaluation of his hematochezia, appetite change, and fatigue. Return precautions discussed and provided. Patient agreeable to plan with no unaddressed concerns. Patient discharged in good condition; VSS.   Filed Vitals:   06/18/14 0040 06/18/14 0042 06/18/14 0501  BP: 135/75  96/68  Pulse: 63  54  Temp: 97.6 F (36.4 C)  97.8 F (36.6 C)  TempSrc: Oral  Oral  Resp: 16  16  Height: 5\' 7"  (1.702 m)    Weight: 140 lb (63.504 kg) 160 lb (72.576 kg)   SpO2: 98%  99%       Antony MaduraKelly Lalah Durango, PA-C 06/20/14 [id number]  April K Palumbo-Rasch, MD 06/23/14 2300

## 2014-06-18 NOTE — Discharge Instructions (Signed)

## 2014-06-20 LAB — GC/CHLAMYDIA PROBE AMP
CT Probe RNA: NEGATIVE
GC Probe RNA: NEGATIVE

## 2017-04-12 ENCOUNTER — Ambulatory Visit
Admission: EM | Admit: 2017-04-12 | Discharge: 2017-04-12 | Disposition: A | Discharge status: Home / Self Care | Payer: BLUE CROSS/BLUE SHIELD | Attending: Family Medicine | Admitting: Family Medicine

## 2017-04-12 DIAGNOSIS — R059 Cough, unspecified: Secondary | ICD-10-CM

## 2017-04-12 DIAGNOSIS — R05 Cough: Secondary | ICD-10-CM | POA: Diagnosis not present

## 2017-04-12 DIAGNOSIS — J0110 Acute frontal sinusitis, unspecified: Secondary | ICD-10-CM | POA: Diagnosis not present

## 2017-04-12 MED ORDER — BENZONATATE 200 MG PO CAPS: 200.0000 mg | ORAL_CAPSULE | Freq: Three times a day (TID) | ORAL | 0 refills | Status: DC | PRN

## 2017-04-12 MED ORDER — AMOXICILLIN 875 MG PO TABS: 875.0000 mg | ORAL_TABLET | Freq: Two times a day (BID) | ORAL | 0 refills | Status: DC

## 2017-04-12 NOTE — Discharge Instructions (Signed)
Over the counter Flonase nasal spray (2 sprays each side once a day)

## 2017-04-12 NOTE — ED Provider Notes (Signed)
[facility]    CSN: [id number] Arrival date & time: 04/12/17  4098     History   Chief Complaint Chief Complaint  Patient presents with  . Cough    HPI Robert Reid is a 36 y.o. male.   The history is provided by the patient.  Cough  Associated symptoms: headaches and rhinorrhea   Associated symptoms: no wheezing   URI  Presenting symptoms: congestion, cough, facial pain, fatigue and rhinorrhea   Severity:  Moderate Onset quality:  Sudden Duration:  1 week Timing:  Constant Progression:  Worsening Chronicity:  New Relieved by:  None tried Ineffective treatments:  None tried Associated symptoms: headaches and sinus pain   Associated symptoms: no wheezing   Risk factors: not elderly, no chronic cardiac disease, no chronic kidney disease, no chronic respiratory disease, no diabetes mellitus, no immunosuppression, no recent illness, no recent travel and no sick contacts     History reviewed. No pertinent past medical history.  There are no active problems to display for this patient.   History reviewed. No pertinent surgical history.     Home Medications    Prior to Admission medications   Medication Sig Start Date End Date Taking? Authorizing Provider  acetaminophen (TYLENOL) 500 MG tablet Take 1 tablet (500 mg total) by mouth every 6 (six) hours as needed. 06/10/14   Arby Barrette, MD  amoxicillin (AMOXIL) 875 MG tablet Take 1 tablet (875 mg total) by mouth 2 (two) times daily. 04/12/17   Payton Mccallum, MD  benzonatate (TESSALON) 200 MG capsule Take 1 capsule (200 mg total) by mouth 3 (three) times daily as needed. 04/12/17   Payton Mccallum, MD  famotidine (PEPCID) 10 MG tablet Take 10 mg by mouth 2 (two) times daily as needed for heartburn.     [provider]  NON FORMULARY Take 574 mg by mouth 2 (two) times daily. Amoxicillin [facility] [place] [facility].    [provider]  Omega-3 Fatty Acids (OMEGA 3 PO) Take 1 capsule by mouth daily.     [provider]  polyethylene glycol powder (GLYCOLAX/MIRALAX) powder Take 17 g by mouth 2 (two) times daily. Until daily soft stools  OTC 06/18/14   Antony Madura, PA-C    Family History Family History  Problem Relation Age of Onset  . Cancer Mother        colon    Social History Social History  Substance Use Topics  . Smoking status: Never Smoker  . Smokeless tobacco: Never Used  . Alcohol use No     Allergies   Patient has no known allergies.   Review of Systems Review of Systems  Constitutional: Positive for fatigue.  HENT: Positive for congestion, rhinorrhea and sinus pain.   Respiratory: Positive for cough. Negative for wheezing.   Neurological: Positive for headaches.     Physical Exam Triage Vital Signs ED Triage Vitals  Enc Vitals Group     BP 04/12/17 0821 118/78     Pulse Rate 04/12/17 0821 (!) 57     Resp 04/12/17 0821 16     Temp 04/12/17 0821 98.2 F (36.8 C)     Temp Source 04/12/17 0821 Oral     SpO2 04/12/17 0821 99 %     Weight 04/12/17 0822 150 lb (68 kg)     Height 04/12/17 0822  (1.676 m)     Head Circumference --      Peak Flow --      Pain Score 04/12/17 0823  2     Pain Loc --      Pain Edu? --      Excl. in GC? --    No data found.   Updated Vital Signs BP 118/78 (BP Location: Left Arm)   Pulse (!) 57   Temp 98.2 F (36.8 C) (Oral)   Resp 16   Ht  (1.676 m)   Wt 150 lb (68 kg)   SpO2 99%   BMI 24.21 kg/m   Visual Acuity Right Eye Distance:   Left Eye Distance:   Bilateral Distance:    Right Eye Near:   Left Eye Near:    Bilateral Near:     Physical Exam  Constitutional: He appears well-developed and well-nourished. No distress.  HENT:  Head: Normocephalic and atraumatic.  Right Ear: Tympanic membrane, external ear and ear canal normal.  Left Ear: Tympanic membrane, external ear and ear canal normal.  Nose: Right sinus exhibits frontal sinus tenderness. Right sinus exhibits no maxillary  sinus tenderness. Left sinus exhibits frontal sinus tenderness. Left sinus exhibits no maxillary sinus tenderness.  Mouth/Throat: Uvula is midline, oropharynx is clear and moist and mucous membranes are normal. No oropharyngeal exudate or tonsillar abscesses.  Eyes: Pupils are equal, round, and reactive to light. Conjunctivae and EOM are normal. Right eye exhibits no discharge. Left eye exhibits no discharge. No scleral icterus.  Neck: Normal range of motion. Neck supple. No tracheal deviation present. No thyromegaly present.  Cardiovascular: Normal rate, regular rhythm and normal heart sounds.   Pulmonary/Chest: Effort normal and breath sounds normal. No stridor. No respiratory distress. He has no wheezes. He has no rales. He exhibits no tenderness.  Lymphadenopathy:    He has no cervical adenopathy.  Neurological: He is alert.  Skin: Skin is warm and dry. No rash noted. He is not diaphoretic.  Nursing note and vitals reviewed.    UC Treatments / Results  Labs (all labs ordered are listed, but only abnormal results are displayed) Labs Reviewed - No data to display  EKG  EKG Interpretation None       Radiology No results found.  Procedures Procedures (including critical care time)  Medications Ordered in UC Medications - No data to display   Initial Impression / Assessment and Plan / UC Course  I have reviewed the triage vital signs and the nursing notes.  Pertinent labs & imaging results that were available during my care of the patient were reviewed by me and considered in my medical decision making (see chart for details).       Final Clinical Impressions(s) / UC Diagnoses   Final diagnoses:  Acute frontal sinusitis, recurrence not specified  Cough    New Prescriptions Discharge Medication List as of 04/12/2017  8:54 AM    START taking these medications   Details  amoxicillin (AMOXIL) 875 MG tablet Take 1 tablet (875 mg total) by mouth 2 (two) times daily.,  Starting Sat 04/12/2017, Normal    benzonatate (TESSALON) 200 MG capsule Take 1 capsule (200 mg total) by mouth 3 (three) times daily as needed., Starting Sat 04/12/2017, Normal       1. diagnosis reviewed with patient 2. rx as per orders above; reviewed possible side effects, interactions, risks and benefits  3. Recommend supportive treatment with rest, fluids, otc analgesics prn  4. Follow-up prn if symptoms worsen or don't improve   Controlled Substance Prescriptions [place] Controlled Substance Registry consulted? Not Applicable   Payton Mccallum, MD 04/12/17 [phone]

## 2017-04-12 NOTE — ED Triage Notes (Signed)
4 days of coughing, sneezing, runny nose and scratchy throat. Feels congested and frontal headache.

## 2017-05-13 ENCOUNTER — Ambulatory Visit
Admission: EM | Admit: 2017-05-13 | Discharge: 2017-05-13 | Disposition: A | Discharge status: Home / Self Care | Payer: BLUE CROSS/BLUE SHIELD | Attending: Family Medicine | Admitting: Family Medicine

## 2017-05-13 ENCOUNTER — Other Ambulatory Visit: Payer: Self-pay

## 2017-05-13 ENCOUNTER — Encounter: Payer: Self-pay | Admitting: Emergency Medicine

## 2017-05-13 DIAGNOSIS — H8149 Vertigo of central origin, unspecified ear: Secondary | ICD-10-CM | POA: Diagnosis not present

## 2017-05-13 DIAGNOSIS — R42 Dizziness and giddiness: Secondary | ICD-10-CM | POA: Diagnosis not present

## 2017-05-13 MED ORDER — MECLIZINE HCL 25 MG PO TABS: 25.0000 mg | ORAL_TABLET | Freq: Three times a day (TID) | ORAL | 0 refills | Status: DC | PRN

## 2017-05-13 NOTE — ED Provider Notes (Signed)
[facility]    CSN: [id number] Arrival date & time: 05/13/17  1722     History   Chief Complaint Chief Complaint  Patient presents with  . Dizziness   HPI  36 year old male presents with the above complaints.  Patient has recently been treated for sinusitis.  He states that yesterday he began to feel poorly.  He said dizziness which he describes as things spinning.  Associated weakness and headache.  No fever.  No chills.  He states that he tried altering his diet no other interventions tried.  No medications tried.  Seems to be worse with movement.  Relieved with rest and closing the eyes.  No other associated symptoms.  No other complaints at this time.  History reviewed. No pertinent past medical history.  History reviewed. No pertinent surgical history.  Home Medications    Prior to Admission medications   Medication Sig Start Date End Date Taking? Authorizing Provider  acetaminophen (TYLENOL) 500 MG tablet Take 1 tablet (500 mg total) by mouth every 6 (six) hours as needed. 06/10/14   Arby BarrettePfeiffer, Marcy, MD  meclizine (ANTIVERT) 25 MG tablet Take 1 tablet (25 mg total) 3 (three) times daily as needed by mouth for dizziness. 05/13/17   Tommie Samsook, Aprill Banko G, DO   Family History Family History  Problem Relation Age of Onset  . Cancer Mother        colon   Social History Social History   Tobacco Use  . Smoking status: Never Smoker  . Smokeless tobacco: Never Used  Substance Use Topics  . Alcohol use: No  . Drug use: No   Allergies   Patient has no known allergies.  Review of Systems Review of Systems  Constitutional: Positive for fatigue.  HENT: Negative.   Neurological: Positive for dizziness and weakness.   Physical Exam Triage Vital Signs ED Triage Vitals  Enc Vitals Group     BP 05/13/17 1734 120/74     Pulse Rate 05/13/17 1734 (!) 57     Resp 05/13/17 1734 16     Temp 05/13/17 1734 98.4 F (36.9 C)     Temp Source 05/13/17 1734 Oral     SpO2  05/13/17 1734 100 %     Weight 05/13/17 1732 150 lb (68 kg)     Height 05/13/17 1732 5\' 6"  (1.676 m)     Head Circumference --      Peak Flow --      Pain Score 05/13/17 1732 0     Pain Loc --      Pain Edu? --      Excl. in GC? --    Updated Vital Signs BP 120/74 (BP Location: Left Arm)   Pulse (!) 57   Temp 98.4 F (36.9 C) (Oral)   Resp 16   Ht 5\' 6"  (1.676 m)   Wt 150 lb (68 kg)   SpO2 100%   BMI 24.21 kg/m   Physical Exam  Constitutional: He is oriented to person, place, and time. He appears well-developed. No distress.  HENT:  Head: Normocephalic and atraumatic.  Eyes: Conjunctivae are normal. No scleral icterus.  Cardiovascular: Normal rate and regular rhythm.  Murmur heard. Pulmonary/Chest: Effort normal and breath sounds normal. He has no wheezes. He has no rales.  Neurological: He is alert and oriented to person, place, and time.  Dizziness elicited with Dix-Hallpike.  No nystagmus noted.  Psychiatric: He has a normal mood and affect.  Vitals reviewed.  UC  Treatments / Results  Labs (all labs ordered are listed, but only abnormal results are displayed) Labs Reviewed - No data to display  EKG  EKG Interpretation None       Radiology No results found.  Procedures Procedures (including critical care time)  Medications Ordered in UC Medications - No data to display   Initial Impression / Assessment and Plan / UC Course  I have reviewed the triage vital signs and the nursing notes.  Pertinent labs & imaging results that were available during my care of the patient were reviewed by me and considered in my medical decision making (see chart for details).     36 year old male presents with vertigo.  Treating with meclizine.  Final Clinical Impressions(s) / UC Diagnoses   Final diagnoses:  Vertigo    ED Discharge Orders        Ordered    meclizine (ANTIVERT) 25 MG tablet  3 times daily PRN     05/13/17 1747     Controlled Substance  Prescriptions [place] Controlled Substance Registry consulted? Not Applicable   Tommie SamsCook, Aliviana Burdell G, DO 05/13/17 1759

## 2017-05-13 NOTE — ED Triage Notes (Signed)
Patient c/o dizziness and weakness that started yesterday.  Patient denies nausea.  Patient reports that when he turns his head it feels like the room is spinning.  Patient reports being treated for sinus infection 2 weeks ago.

## 2017-05-25 ENCOUNTER — Ambulatory Visit
Admission: EM | Admit: 2017-05-25 | Discharge: 2017-05-25 | Disposition: A | Discharge status: Home / Self Care | Payer: BLUE CROSS/BLUE SHIELD | Attending: Family Medicine | Admitting: Family Medicine

## 2017-05-25 ENCOUNTER — Other Ambulatory Visit: Payer: Self-pay

## 2017-05-25 DIAGNOSIS — H6981 Other specified disorders of Eustachian tube, right ear: Secondary | ICD-10-CM | POA: Diagnosis not present

## 2017-05-25 DIAGNOSIS — H9201 Otalgia, right ear: Secondary | ICD-10-CM

## 2017-05-25 DIAGNOSIS — R42 Dizziness and giddiness: Secondary | ICD-10-CM

## 2017-05-25 MED ORDER — FLUTICASONE PROPIONATE 50 MCG/ACT NA SUSP: 2.0000 | Freq: Every day | NASAL | 0 refills | Status: DC

## 2017-05-25 NOTE — ED Triage Notes (Signed)
Patient c/o dizziness, nausea and weakness x 2 weeks. Patient was seen on 05/13/17 and was given meclizine. The medication did give slight relief from dizziness. Patient states he is also experiencing right ear pain since yesterday.

## 2017-05-25 NOTE — ED Provider Notes (Signed)
[facility]    CSN: [id number] Arrival date & time: 05/25/17  1113     History   Chief Complaint Chief Complaint  Patient presents with  . Dizziness    HPI Robert Reid is a 36 y.o. male.   HPI  This is a 36 year old male who returns today following being seen on 05/13/2017 diagnosed with vertigo and treated with meclizine. He states that the meclizine does help; when he doesn't take it he is continues to have the dizziness and weakness. Review of his records it was found to have an acute frontal sinusitis on 04/12/2017. He denies any nausea or vomiting. He states that when he does have the dizziness it feels as if the room is moving on him. He further complains of right ear pain and a feeling of fullness.        History reviewed. No pertinent past medical history.  There are no active problems to display for this patient.   History reviewed. No pertinent surgical history.     Home Medications    Prior to Admission medications   Medication Sig Start Date End Date Taking? Authorizing Provider  meclizine (ANTIVERT) 25 MG tablet Take 1 tablet (25 mg total) 3 (three) times daily as needed by mouth for dizziness. 05/13/17  Yes Cook, Jayce G, DO  fluticasone (FLONASE) 50 MCG/ACT nasal spray Place 2 sprays daily into both nostrils. 05/25/17   Lutricia Feiloemer, Mauria Asquith P, PA-C    Family History Family History  Problem Relation Age of Onset  . Cancer Mother        colon    Social History Social History   Tobacco Use  . Smoking status: Never Smoker  . Smokeless tobacco: Never Used  Substance Use Topics  . Alcohol use: No  . Drug use: No     Allergies   Patient has no known allergies.   Review of Systems Review of Systems  Constitutional: Positive for activity change and fatigue. Negative for chills and fever.  HENT: Positive for ear pain.   Neurological: Positive for dizziness.  All other systems reviewed and are negative.    Physical  Exam Triage Vital Signs ED Triage Vitals  Enc Vitals Group     BP 05/25/17 1133 119/69     Pulse Rate 05/25/17 1133 (!) 58     Resp --      Temp 05/25/17 1133 (!) 97.5 F (36.4 C)     Temp Source 05/25/17 1133 Oral     SpO2 05/25/17 1133 100 %     Weight 05/25/17 1135 150 lb (68 kg)     Height 05/25/17 1135 5\' 6"  (1.676 m)     Head Circumference --      Peak Flow --      Pain Score 05/25/17 1157 0     Pain Loc --      Pain Edu? --      Excl. in GC? --    No data found.  Updated Vital Signs BP 119/69 (BP Location: Left Arm)   Pulse (!) 58   Temp (!) 97.5 F (36.4 C) (Oral)   Ht 5\' 6"  (1.676 m)   Wt 150 lb (68 kg)   SpO2 100%   BMI 24.21 kg/m   Visual Acuity Right Eye Distance:   Left Eye Distance:   Bilateral Distance:    Right Eye Near:   Left Eye Near:    Bilateral Near:     Physical Exam  Constitutional: He is  oriented to person, place, and time. He appears well-developed and well-nourished. No distress.  HENT:  Head: Normocephalic.  Left Ear: External ear normal.  Nose: Nose normal.  Mouth/Throat: Oropharynx is clear and moist. No oropharyngeal exudate.  Right TM appears dull.  Eyes: EOM are normal. Pupils are equal, round, and reactive to light. Right eye exhibits no discharge. Left eye exhibits no discharge.  Neck: Normal range of motion. Neck supple.  Musculoskeletal: Normal range of motion.  Lymphadenopathy:    He has no cervical adenopathy.  Neurological: He is alert and oriented to person, place, and time. He displays normal reflexes. No cranial nerve deficit or sensory deficit. He exhibits normal muscle tone. Coordination normal.  Skin: Skin is warm and dry. He is not diaphoretic.  Psychiatric: He has a normal mood and affect. His behavior is normal. Judgment and thought content normal.  Nursing note and vitals reviewed.    UC Treatments / Results  Labs (all labs ordered are listed, but only abnormal results are displayed) Labs Reviewed - No  data to display  EKG  EKG Interpretation None       Radiology No results found.  Procedures Procedures (including critical care time)  Medications Ordered in UC Medications - No data to display   Initial Impression / Assessment and Plan / UC Course  I have reviewed the triage vital signs and the nursing notes.  Pertinent labs & imaging results that were available during my care of the patient were reviewed by me and considered in my medical decision making (see chart for details).    Plan: 1. Test/x-ray results and diagnosis reviewed with patient 2. rx as per orders; risks, benefits, potential side effects reviewed with patient 3. Recommend supportive treatment with use of Flonase daily. I have given him instructions on proper use technique. I've encouraged him to follow-up with ENT specialist.A phone number was provided to the patient. He will schedule the appointment. 4. F/u prn if symptoms worsen or don't improve   Final Clinical Impressions(s) / UC Diagnoses   Final diagnoses:  Eustachian tube dysfunction, right    ED Discharge Orders        Ordered    fluticasone (FLONASE) 50 MCG/ACT nasal spray  Daily     05/25/17 1153       Controlled Substance Prescriptions [place] Controlled Substance Registry consulted? Not Applicable   Lutricia FeilRoemer, Keitha Kolk P, PA-C 05/25/17 1228

## 2017-07-09 ENCOUNTER — Ambulatory Visit
Admission: EM | Admit: 2017-07-09 | Discharge: 2017-07-09 | Discharge status: Absent without leave | Payer: BLUE CROSS/BLUE SHIELD

## 2017-09-06 ENCOUNTER — Other Ambulatory Visit: Payer: Self-pay

## 2017-09-06 ENCOUNTER — Ambulatory Visit
Admission: EM | Admit: 2017-09-06 | Discharge: 2017-09-06 | Disposition: A | Discharge status: Home / Self Care | Payer: BLUE CROSS/BLUE SHIELD | Attending: Family Medicine | Admitting: Family Medicine

## 2017-09-06 DIAGNOSIS — R42 Dizziness and giddiness: Secondary | ICD-10-CM | POA: Insufficient documentation

## 2017-09-06 DIAGNOSIS — R079 Chest pain, unspecified: Secondary | ICD-10-CM | POA: Insufficient documentation

## 2017-09-06 DIAGNOSIS — M940 Chondrocostal junction syndrome [Tietze]: Secondary | ICD-10-CM | POA: Insufficient documentation

## 2017-09-06 DIAGNOSIS — R001 Bradycardia, unspecified: Secondary | ICD-10-CM | POA: Diagnosis not present

## 2017-09-06 DIAGNOSIS — R0789 Other chest pain: Secondary | ICD-10-CM | POA: Diagnosis not present

## 2017-09-06 DIAGNOSIS — Z79899 Other long term (current) drug therapy: Secondary | ICD-10-CM | POA: Insufficient documentation

## 2017-09-06 DIAGNOSIS — Z800 Family history of malignant neoplasm of digestive organs: Secondary | ICD-10-CM | POA: Diagnosis not present

## 2017-09-06 HISTORY — DX: Dizziness and giddiness: R42

## 2017-09-06 NOTE — ED Provider Notes (Signed)
[facility]    CSN: [id number] Arrival date & time: 09/06/17  1524     History   Chief Complaint Chief Complaint  Patient presents with  . Chest Pain    HPI Robert Reid is a 37 y.o. male.   The history is provided by the patient.  Chest Pain  Pain location:  L lateral chest Pain quality: sharp   Pain radiates to:  Does not radiate Pain severity:  Mild Onset quality:  Sudden Duration:  4 days Timing:  Sporadic Progression:  Unchanged Chronicity:  New Context: movement   Relieved by:  Aspirin Worsened by:  Movement and certain positions Associated symptoms: no abdominal pain, no AICD problem, no altered mental status, no anorexia, no anxiety, no back pain, no claudication, no cough, no diaphoresis, no dizziness, no dysphagia, no fatigue, no fever, no headache, no heartburn, no lower extremity edema, no nausea, no near-syncope, no numbness, no orthopnea, no palpitations, no PND, no shortness of breath, no syncope, no vomiting and no weakness   Risk factors: no aortic disease, no birth control, no coronary artery disease, no diabetes mellitus, no Ehlers-Danlos syndrome, no high cholesterol, no hypertension, no immobilization, not male, no Marfan's syndrome, not obese, not pregnant, no prior DVT/PE, no smoking and no surgery     Past Medical History:  Diagnosis Date  . Vertigo     There are no active problems to display for this patient.   History reviewed. No pertinent surgical history.     Home Medications    Prior to Admission medications   Medication Sig Start Date End Date Taking? Authorizing Provider  fluticasone (FLONASE) 50 MCG/ACT nasal spray Place 2 sprays daily into both nostrils. 05/25/17   Lutricia Feil, PA-C  meclizine (ANTIVERT) 25 MG tablet Take 1 tablet (25 mg total) 3 (three) times daily as needed by mouth for dizziness. 05/13/17   Tommie Sams, DO    Family History Family History  Problem Relation Age of Onset  . Cancer  Mother        colon    Social History Social History   Tobacco Use  . Smoking status: Never Smoker  . Smokeless tobacco: Never Used  Substance Use Topics  . Alcohol use: No  . Drug use: No     Allergies   Patient has no known allergies.   Review of Systems Review of Systems  Constitutional: Negative for diaphoresis, fatigue and fever.  HENT: Negative for trouble swallowing.   Respiratory: Negative for cough and shortness of breath.   Cardiovascular: Positive for chest pain. Negative for palpitations, orthopnea, claudication, syncope, PND and near-syncope.  Gastrointestinal: Negative for abdominal pain, anorexia, heartburn, nausea and vomiting.  Musculoskeletal: Negative for back pain.  Neurological: Negative for dizziness, weakness, numbness and headaches.     Physical Exam Triage Vital Signs ED Triage Vitals  Enc Vitals Group     BP 09/06/17 1538 122/72     Pulse Rate 09/06/17 1538 (!) 59     Resp 09/06/17 1538 16     Temp 09/06/17 1538 98.1 F (36.7 C)     Temp Source 09/06/17 1538 Oral     SpO2 09/06/17 1538 98 %     Weight 09/06/17 1533 158 lb (71.7 kg)     Height 09/06/17 1533 5\' 7"  (1.702 m)     Head Circumference --      Peak Flow --      Pain Score 09/06/17 1533 3  Pain Loc --      Pain Edu? --      Excl. in GC? --    No data found.  Updated Vital Signs BP 122/72 (BP Location: Right Arm)   Pulse (!) 59   Temp 98.1 F (36.7 C) (Oral)   Resp 16   Ht 5\' 7"  (1.702 m)   Wt 158 lb (71.7 kg)   SpO2 98%   BMI 24.75 kg/m   Visual Acuity Right Eye Distance:   Left Eye Distance:   Bilateral Distance:    Right Eye Near:   Left Eye Near:    Bilateral Near:     Physical Exam  Constitutional: He appears well-developed and well-nourished. No distress.  HENT:  Head: Normocephalic and atraumatic.  Right Ear: Tympanic membrane, external ear and ear canal normal.  Left Ear: Tympanic membrane, external ear and ear canal normal.  Nose: Nose  normal.  Mouth/Throat: Uvula is midline, oropharynx is clear and moist and mucous membranes are normal. No oropharyngeal exudate or tonsillar abscesses.  Eyes: Conjunctivae are normal. Right eye exhibits no discharge. Left eye exhibits no discharge. No scleral icterus.  Neck: Normal range of motion. Neck supple. No tracheal deviation present. No thyromegaly present.  Cardiovascular: Normal rate, regular rhythm and normal heart sounds.  Pulmonary/Chest: Effort normal and breath sounds normal. No stridor. No respiratory distress. He has no wheezes. He has no rales. He exhibits tenderness (to palpation over left lateral chest wall muscles).  Lymphadenopathy:    He has no cervical adenopathy.  Neurological: He is alert.  Skin: Skin is warm and dry. No rash noted. He is not diaphoretic.  Nursing note and vitals reviewed.    UC Treatments / Results  Labs (all labs ordered are listed, but only abnormal results are displayed) Labs Reviewed - No data to display  EKG  EKG Interpretation None       Radiology No results found.  Procedures ED EKG Date/Time: 09/06/2017 4:06 PM Performed by: Payton Mccallumonty, Trinka Keshishyan, MD Authorized by: Payton Mccallumonty, Hawke Villalpando, MD   ECG reviewed by ED Physician in the absence of a cardiologist: yes   Previous ECG:    Previous ECG:  Unavailable Interpretation:    Interpretation: abnormal   Rate:    ECG rate assessment: bradycardic   Rhythm:    Rhythm: sinus bradycardia   Ectopy:    Ectopy: none   QRS:    QRS axis:  Normal Conduction:    Conduction: normal   ST segments:    ST segments:  Normal T waves:    T waves: normal     (including critical care time)  Medications Ordered in UC Medications - No data to display   Initial Impression / Assessment and Plan / UC Course  I have reviewed the triage vital signs and the nursing notes.  Pertinent labs & imaging results that were available during my care of the patient were reviewed by me and considered in my  medical decision making (see chart for details).       Final Clinical Impressions(s) / UC Diagnoses   Final diagnoses:  Costochondritis  Sinus bradycardia by electrocardiogram    ED Discharge Orders    None     1. ekg results and diagnosis reviewed with patient 2.Recommend supportive treatment with otc NSAIDS prn, rest, ice to area 3. Follow up with PCP for further evaluation if necessary 4. Follow-up prn if symptoms worsen or don't improve  Controlled Substance Prescriptions [place] Controlled Substance Registry consulted? Not  Cheron Schaumann, MD 09/06/17 [phone]

## 2017-09-06 NOTE — ED Triage Notes (Signed)
Pt with right sided chest pain starting early Wednesday morning while at work. It went away on its own but awoke this a.m. With similar pain but this time on the left side of the chest. Pain 3/10. Pain is worse with movement.

## 2017-09-06 NOTE — Discharge Instructions (Signed)
Ibuprofen over the counter Rest, ice

## 2017-09-19 ENCOUNTER — Encounter: Payer: Self-pay | Admitting: Family Medicine

## 2017-09-19 ENCOUNTER — Ambulatory Visit: Payer: BLUE CROSS/BLUE SHIELD | Admitting: Family Medicine

## 2017-09-19 VITALS — BP 120/80 | HR 64 | Ht 67.0 in | Wt 160.0 lb

## 2017-09-19 DIAGNOSIS — Z7689 Persons encountering health services in other specified circumstances: Secondary | ICD-10-CM | POA: Diagnosis not present

## 2017-09-19 NOTE — Progress Notes (Signed)
Name: Robert Reid   MRN: [id number]    DOB: 1981-05-28   Date:09/19/2017       Progress Note  Subjective  Chief Complaint  Chief Complaint  Patient presents with  . Establish Care    just needs a pcp    Patient present for establish care with new physician.    No problem-specific Assessment & Plan notes found for this encounter.   Past Medical History:  Diagnosis Date  . Vertigo     History reviewed. No pertinent surgical history.  Family History  Problem Relation Age of Onset  . Cancer Mother        colon    Social History   Socioeconomic History  . Marital status: Single    Spouse name: Not on file  . Number of children: Not on file  . Years of education: Not on file  . Highest education level: Not on file  Social Needs  . Financial resource strain: Not on file  . Food insecurity - worry: Not on file  . Food insecurity - inability: Not on file  . Transportation needs - medical: Not on file  . Transportation needs - non-medical: Not on file  Occupational History  . Not on file  Tobacco Use  . Smoking status: Never Smoker  . Smokeless tobacco: Never Used  Substance and Sexual Activity  . Alcohol use: No  . Drug use: No  . Sexual activity: Yes  Other Topics Concern  . Not on file  Social History Narrative  . Not on file    No Known Allergies  Outpatient Medications Prior to Visit  Medication Sig Dispense Refill  . fluticasone (FLONASE) 50 MCG/ACT nasal spray Place 2 sprays daily into both nostrils. 16 g 0  . meclizine (ANTIVERT) 25 MG tablet Take 1 tablet (25 mg total) 3 (three) times daily as needed by mouth for dizziness. 30 tablet 0   No facility-administered medications prior to visit.     Review of Systems  Constitutional: Negative for chills, fever, malaise/fatigue and weight loss.  HENT: Negative for ear discharge, ear pain and sore throat.   Eyes: Negative for blurred vision.  Respiratory: Negative for cough, sputum production,  shortness of breath and wheezing.   Cardiovascular: Negative for chest pain, palpitations and leg swelling.  Gastrointestinal: Negative for abdominal pain, blood in stool, constipation, diarrhea, heartburn, melena and nausea.  Genitourinary: Negative for dysuria, frequency, hematuria and urgency.  Musculoskeletal: Negative for back pain, joint pain, myalgias and neck pain.  Skin: Negative for rash.  Neurological: Negative for dizziness, tingling, sensory change, focal weakness and headaches.  Endo/Heme/Allergies: Negative for environmental allergies and polydipsia. Does not bruise/bleed easily.  Psychiatric/Behavioral: Negative for depression and suicidal ideas. The patient is not nervous/anxious and does not have insomnia.      Objective  Vitals:   09/19/17 1556  BP: 120/80  Pulse: 64  Weight: 160 lb (72.6 kg)  Height: 5\' 7"  (1.702 m)    Physical Exam  Constitutional: He is oriented to person, place, and time and well-developed, well-nourished, and in no distress.  HENT:  Head: Normocephalic.  Right Ear: External ear normal.  Left Ear: External ear normal.  Nose: Nose normal.  Mouth/Throat: Oropharynx is clear and moist.  Eyes: Conjunctivae and EOM are normal. Pupils are equal, round, and reactive to light. Right eye exhibits no discharge. Left eye exhibits no discharge. No scleral icterus.  Neck: Normal range of motion. Neck supple. No JVD present. No tracheal deviation present.  No thyromegaly present.  Cardiovascular: Normal rate, regular rhythm, normal heart sounds and intact distal pulses. Exam reveals no gallop and no friction rub.  No murmur heard. Pulmonary/Chest: Breath sounds normal. No respiratory distress. He has no wheezes. He has no rales.  Abdominal: Soft. Bowel sounds are normal. He exhibits no mass. There is no hepatosplenomegaly. There is no tenderness. There is no rebound, no guarding and no CVA tenderness.  Musculoskeletal: He exhibits no edema or tenderness.   Lymphadenopathy:    He has no cervical adenopathy.  Neurological: He is alert and oriented to person, place, and time. He has normal sensation, normal strength and intact cranial nerves.  Skin: Skin is warm. No rash noted.  Psychiatric: Mood and affect normal.  Nursing note and vitals reviewed.     Assessment & Plan  Problem List Items Addressed This Visit    None    Visit Diagnoses    Establishing care with new doctor, encounter for    -  Primary      No orders of the defined types were placed in this encounter.     Dr. Hayden [facility]  09/19/17

## 2019-08-18 ENCOUNTER — Emergency Department (HOSPITAL_COMMUNITY)
Admission: EM | Admit: 2019-08-18 | Discharge: 2019-08-18 | Disposition: A | Discharge status: Home / Self Care | Payer: Self-pay | Attending: Emergency Medicine | Admitting: Emergency Medicine

## 2019-08-18 ENCOUNTER — Emergency Department (HOSPITAL_COMMUNITY): Payer: Self-pay

## 2019-08-18 ENCOUNTER — Encounter (HOSPITAL_COMMUNITY): Payer: Self-pay | Admitting: Emergency Medicine

## 2019-08-18 ENCOUNTER — Other Ambulatory Visit: Payer: Self-pay

## 2019-08-18 DIAGNOSIS — R0789 Other chest pain: Secondary | ICD-10-CM | POA: Insufficient documentation

## 2019-08-18 DIAGNOSIS — Z20822 Contact with and (suspected) exposure to covid-19: Secondary | ICD-10-CM | POA: Insufficient documentation

## 2019-08-18 LAB — BASIC METABOLIC PANEL
Anion gap: 12 (ref 5–15)
BUN: 7 mg/dL (ref 6–20)
CO2: 23 mmol/L (ref 22–32)
Calcium: 9.4 mg/dL (ref 8.9–10.3)
Chloride: 103 mmol/L (ref 98–111)
Creatinine, Ser: 0.85 mg/dL (ref 0.61–1.24)
GFR calc Af Amer: >60 mL/min (ref >60)
GFR calc non Af Amer: >60 mL/min (ref >60)
Glucose, Bld: 105 mg/dL — ABNORMAL HIGH (ref 70–99)
Potassium: 4 mmol/L (ref 3.5–5.1)
Sodium: 138 mmol/L (ref 135–145)

## 2019-08-18 LAB — CBC
HCT: 37.5 % — ABNORMAL LOW (ref 39.0–52.0)
Hemoglobin: 13.4 g/dL (ref 13.0–17.0)
MCH: 28.8 pg (ref 26.0–34.0)
MCHC: 35.7 g/dL (ref 30.0–36.0)
MCV: 80.6 fL (ref 80.0–100.0)
Platelets: 308 10*3/uL (ref 150–400)
RBC: 4.65 MIL/uL (ref 4.22–5.81)
RDW: 15.9 % — ABNORMAL HIGH (ref 11.5–15.5)
WBC: 11 10*3/uL — ABNORMAL HIGH (ref 4.0–10.5)
nRBC: 0.4 % — ABNORMAL HIGH (ref 0.0–0.2)

## 2019-08-18 LAB — TROPONIN I (HIGH SENSITIVITY): Troponin I (High Sensitivity): 7 ng/L (ref <18)

## 2019-08-18 MED ORDER — SODIUM CHLORIDE 0.9% FLUSH: 3.0000 mL | Freq: Once | INTRAVENOUS | Status: DC

## 2019-08-18 MED ORDER — ALUM & MAG HYDROXIDE-SIMETH 200-200-20 MG/5ML PO SUSP
30.0000 mL | Freq: Once | ORAL | Status: AC
  Administered 2019-08-18: 30 mL via ORAL
  Filled 2019-08-18: qty 30

## 2019-08-18 NOTE — ED Notes (Signed)
Upon discharge pt reports Maalox/Mylanta completely resolved his discomfort.

## 2019-08-18 NOTE — ED Provider Notes (Signed)
[place] [facility] DEPART[facility] Provider Note   CSN: [id number] Arrival date & time: 08/18/19  0424     History Chief Complaint  Patient presents with  . Chest Pain    Robert Reid is a 39 y.o. male.  39 yo M with a chief complaints of right-sided chest pain.  This is worse at rest and better with exertion.  Going on for about a week.  No significant change in the past 12 hours but he started to get concerned about and so came to the ED for evaluation.  Has had a mild cough with this.  Also describes fever as high as 100 at home.  No nausea vomiting or diarrhea.  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  They were isolated at home for 10 days and returned.  He was seen at a student health clinic in the past couple days and had a Covid test that was negative.  He denies history of PE or DVT denies hemoptysis denies unilateral lower extremity edema denies recent surgery immobilization.  Denies history of cancer.  Denies testosterone use.  Denies history of hypertension, hyperlipidemia, diabetes, smoking, family history of MI.  The history is provided by the patient.  Chest Pain Pain location:  R chest Pain quality: aching   Pain radiates to:  Does not radiate Pain severity:  Moderate Onset quality:  Gradual Duration:  4 days Timing:  Intermittent Progression:  Waxing and waning Chronicity:  New Relieved by: exertion. Exacerbated by: rest. Ineffective treatments:  None tried Associated symptoms: no abdominal pain, no fever, no headache, no palpitations, no shortness of breath and no vomiting        Past Medical History:  Diagnosis Date  . Vertigo     There are no problems to display for this patient.   History reviewed. No pertinent surgical history.     Family History  Problem Relation Age of Onset  . Cancer Mother        colon    Social History   Tobacco Use  . Smoking status: Never  Smoker  . Smokeless tobacco: Never Used  Substance Use Topics  . Alcohol use: No  . Drug use: No    Home Medications Prior to Admission medications   Not on File    Allergies    Patient has no known allergies.  Review of Systems   Review of Systems  Constitutional: Negative for chills and fever.  HENT: Negative for congestion and facial swelling.   Eyes: Negative for discharge and visual disturbance.  Respiratory: Negative for shortness of breath.   Cardiovascular: Positive for chest pain. Negative for palpitations.  Gastrointestinal: Negative for abdominal pain, diarrhea and vomiting.  Musculoskeletal: Negative for arthralgias and myalgias.  Skin: Negative for color change and rash.  Neurological: Negative for tremors, syncope and headaches.  Psychiatric/Behavioral: Negative for confusion and dysphoric mood.    Physical Exam Updated Vital Signs BP 127/84 (BP Location: Right Arm)   Pulse 83   Temp 98.3 F (36.8 C) (Oral)   Resp 18   SpO2 100%   Physical Exam Vitals and nursing note reviewed.  Constitutional:      Appearance: He is well-developed.  HENT:     Head: Normocephalic and atraumatic.  Eyes:     Pupils: Pupils are equal, round, and reactive to light.  Neck:     Vascular: No JVD.  Cardiovascular:     Rate  and Rhythm: Normal rate and regular rhythm.     Heart sounds: No murmur. No friction rub. No gallop.   Pulmonary:     Effort: No respiratory distress.     Breath sounds: No wheezing.  Abdominal:     General: There is no distension.     Tenderness: There is no guarding or rebound.  Musculoskeletal:        General: Normal range of motion.     Cervical back: Normal range of motion and neck supple.  Skin:    Coloration: Skin is not pale.     Findings: No rash.  Neurological:     Mental Status: He is alert and oriented to person, place, and time.  Psychiatric:        Behavior: Behavior normal.     ED Results / Procedures / Treatments    Labs (all labs ordered are listed, but only abnormal results are displayed) Labs Reviewed  BASIC METABOLIC PANEL - Abnormal; Notable for the following components:      Result Value   Glucose, Bld 105 (*)    All other components within normal limits  CBC - Abnormal; Notable for the following components:   WBC 11.0 (*)    HCT 37.5 (*)    RDW 15.9 (*)    nRBC 0.4 (*)    All other components within normal limits  NOVEL CORONAVIRUS, NAA (HOSP ORDER, SEND-OUT TO REF LAB; TAT 18-24 HRS)  TROPONIN I (HIGH SENSITIVITY)  TROPONIN I (HIGH SENSITIVITY)    EKG EKG Interpretation  Date/Time:  Wednesday August 18 2019 04:28:10 EST Ventricular Rate:  94 PR Interval:  144 QRS Duration: 84 QT Interval:  356 QTC Calculation: 445 R Axis:   40 Text Interpretation: Normal sinus rhythm Possible Anterior infarct , age undetermined Abnormal ECG No significant change since last tracing Confirmed by Melene Plan [phone]) on 08/18/2019 5:32:31 AM   Radiology DG Chest 2 View  Result Date: 08/18/2019 CLINICAL DATA:  Cramping sensation in the chest EXAM: CHEST - 2 VIEW COMPARISON:  Radiograph 06/10/2014 FINDINGS: No consolidation, features of edema, pneumothorax, or effusion. Pulmonary vascularity is normally distributed. The cardiomediastinal contours are unremarkable. No acute osseous or soft tissue abnormality. IMPRESSION: No acute cardiopulmonary abnormality. Electronically Signed   By: Kreg Shropshire M.D.   On: 08/18/2019 05:04    Procedures Procedures (including critical care time)  Medications Ordered in ED Medications  sodium chloride flush (NS) 0.9 % injection 3 mL (3 mLs Intravenous Not Given 08/18/19 0551)  alum & mag hydroxide-simeth (MAALOX/MYLANTA) 200-200-20 MG/5ML suspension 30 mL (30 mLs Oral Given 08/18/19 0608)    ED Course  I have reviewed the triage vital signs and the nursing notes.  Pertinent labs & imaging results that were available during my care of the patient were reviewed  by me and considered in my medical decision making (see chart for details).    MDM Rules/Calculators/A&P                      39 yo M with a chief complaint of right-sided chest pain.  This is atypical in nature.  He is well-appearing nontoxic.  He is PERC negative.  His history is somewhat concerning for the novel coronavirus as he had a known exposure and perhaps his test was recently negative because it was obtained suboptimally or too early in the course of his symptoms.  We will repeated test today.  Have him self isolate at home.  Otherwise  his symptoms sound like reflux with the exception of the reported fever.  Could also be a GI illness.  We will have him follow-up with his family doctor.  6:30 AM:  I have discussed the diagnosis/risks/treatment options with the patient and believe the pt to be eligible for discharge home to follow-up with PCP. We also discussed returning to the ED immediately if new or worsening sx occur. We discussed the sx which are most concerning (e.g., sudden worsening pain, fever, inability to tolerate by mouth) that necessitate immediate return. Medications administered to the patient during their visit and any new prescriptions provided to the patient are listed below.  Medications given during this visit Medications  sodium chloride flush (NS) 0.9 % injection 3 mL (3 mLs Intravenous Not Given 08/18/19 0551)  alum & mag hydroxide-simeth (MAALOX/MYLANTA) 200-200-20 MG/5ML suspension 30 mL (30 mLs Oral Given 08/18/19 0608)     The patient appears reasonably screen and/or stabilized for discharge and I doubt any other medical condition or other [facility] requiring further screening, evaluation, or treatment in the ED at this time prior to discharge.   Final Clinical Impression(s) / ED Diagnoses Final diagnoses:  Atypical chest pain    Rx / DC Orders ED Discharge Orders    None       Melene Plan, DO 08/18/19 0630

## 2019-08-18 NOTE — Discharge Instructions (Addendum)
Try zantac or pepcid twice a day.  Try to avoid things that may make this worse, most commonly these are spicy foods tomato based products fatty foods chocolate and peppermint.  Alcohol and tobacco can also make this worse.  Return to the emergency department for sudden worsening pain fever or inability to eat or drink. ° °

## 2019-08-18 NOTE — ED Triage Notes (Signed)
Patient reports right chest " cramping" radiating to right shoulder and right upper back onset Sunday this week , denies SOB  , no emesis or diaphoresis , no cough or fever .

## 2019-08-19 LAB — NOVEL CORONAVIRUS, NAA (HOSP ORDER, SEND-OUT TO REF LAB; TAT 18-24 HRS): SARS-CoV-2, NAA: NOT DETECTED

## 2019-08-29 ENCOUNTER — Encounter (HOSPITAL_COMMUNITY): Payer: Self-pay

## 2019-08-29 ENCOUNTER — Other Ambulatory Visit: Payer: Self-pay

## 2019-08-29 ENCOUNTER — Ambulatory Visit (HOSPITAL_COMMUNITY)
Admission: EM | Admit: 2019-08-29 | Discharge: 2019-08-29 | Disposition: A | Discharge status: Home / Self Care | Payer: Self-pay

## 2019-08-29 DIAGNOSIS — K219 Gastro-esophageal reflux disease without esophagitis: Secondary | ICD-10-CM

## 2019-08-29 HISTORY — DX: Gastro-esophageal reflux disease without esophagitis: K21.9

## 2019-08-29 LAB — POCT URINALYSIS DIP (DEVICE)
Bilirubin Urine: NEGATIVE
Glucose, UA: NEGATIVE mg/dL
Hgb urine dipstick: NEGATIVE
Ketones, ur: NEGATIVE mg/dL
Leukocytes,Ua: NEGATIVE
Nitrite: NEGATIVE
Protein, ur: NEGATIVE mg/dL
Specific Gravity, Urine: 1.015 (ref 1.005–1.030)
Urobilinogen, UA: 0.2 mg/dL (ref 0.0–1.0)
pH: 7 (ref 5.0–8.0)

## 2019-08-29 LAB — COMPREHENSIVE METABOLIC PANEL
ALT: 54 U/L — ABNORMAL HIGH (ref 0–44)
AST: 43 U/L — ABNORMAL HIGH (ref 15–41)
Albumin: 4.1 g/dL (ref 3.5–5.0)
Alkaline Phosphatase: 56 U/L (ref 38–126)
Anion gap: 9 (ref 5–15)
BUN: 10 mg/dL (ref 6–20)
CO2: 24 mmol/L (ref 22–32)
Calcium: 9.3 mg/dL (ref 8.9–10.3)
Chloride: 106 mmol/L (ref 98–111)
Creatinine, Ser: 0.8 mg/dL (ref 0.61–1.24)
GFR calc Af Amer: >60 mL/min (ref >60)
GFR calc non Af Amer: >60 mL/min (ref >60)
Glucose, Bld: 106 mg/dL — ABNORMAL HIGH (ref 70–99)
Potassium: 4.1 mmol/L (ref 3.5–5.1)
Sodium: 139 mmol/L (ref 135–145)
Total Bilirubin: 1.1 mg/dL (ref 0.3–1.2)
Total Protein: 7.8 g/dL (ref 6.5–8.1)

## 2019-08-29 LAB — LIPASE, BLOOD: Lipase: 40 U/L (ref 11–51)

## 2019-08-29 LAB — CBC
HCT: 36.6 % — ABNORMAL LOW (ref 39.0–52.0)
Hemoglobin: 12.8 g/dL — ABNORMAL LOW (ref 13.0–17.0)
MCH: 28.6 pg (ref 26.0–34.0)
MCHC: 35 g/dL (ref 30.0–36.0)
MCV: 81.9 fL (ref 80.0–100.0)
Platelets: 422 10*3/uL — ABNORMAL HIGH (ref 150–400)
RBC: 4.47 MIL/uL (ref 4.22–5.81)
RDW: 16.7 % — ABNORMAL HIGH (ref 11.5–15.5)
WBC: 11.3 10*3/uL — ABNORMAL HIGH (ref 4.0–10.5)
nRBC: 1 % — ABNORMAL HIGH (ref 0.0–0.2)

## 2019-08-29 MED ORDER — OMEPRAZOLE 20 MG PO CPDR: 20.0000 mg | DELAYED_RELEASE_CAPSULE | Freq: Every day | ORAL | 0 refills | Status: DC

## 2019-08-29 NOTE — ED Provider Notes (Signed)
[place]    CSN: [id number] Arrival date & time: 08/29/19  1251      History   Chief Complaint Chief Complaint  Patient presents with  . Flank Pain  . Chest Pain    HPI Robert Reid is a 39 y.o. male.   Robert Reid presents with complaints of abdominal pain, primarily to LUQ at this time. Had similar but more to right chest an back on 2/10 and went to the er. Was diagnosed with gerd and started on pepcid which did help with his symptoms. He has stopped taking pepcid. Today after eating he felt nausea and had some sharp pain. It has improved some since. Activity and working out seemed to help it. No current pain. Eating seems to be the trigger of symptoms. Had a BM today, no specific constipation. No vomiting. He doesn't smoke. Mother with a history of h. Pylori and stomach cancer.    ROS per HPI, negative if not otherwise mentioned.      Past Medical History:  Diagnosis Date  . GERD (gastroesophageal reflux disease)   . Vertigo     There are no problems to display for this patient.   History reviewed. No pertinent surgical history.     Home Medications    Prior to Admission medications   Medication Sig Start Date End Date Taking? Authorizing Provider  ondansetron (ZOFRAN-ODT) 8 MG disintegrating tablet ondansetron odt 8 mg tbdp   Yes [provider]  omeprazole (PRILOSEC) 20 MG capsule Take 1 capsule (20 mg total) by mouth daily. 08/29/19   Zigmund Gottron, NP    Family History Family History  Problem Relation Age of Onset  . Cancer Mother        colon    Social History Social History   Tobacco Use  . Smoking status: Never Smoker  . Smokeless tobacco: Never Used  Substance Use Topics  . Alcohol use: No  . Drug use: No     Allergies   Patient has no known allergies.   Review of Systems Review of Systems   Physical Exam Triage Vital Signs ED Triage Vitals  Enc Vitals Group     BP 08/29/19 1340 118/83    Pulse Rate 08/29/19 1340 64     Resp 08/29/19 1340 16     Temp 08/29/19 1340 98.2 F (36.8 C)     Temp Source 08/29/19 1340 Oral     SpO2 08/29/19 1340 98 %     Weight --      Height --      Head Circumference --      Peak Flow --      Pain Score 08/29/19 1352 4     Pain Loc --      Pain Edu? --      Excl. in [place]? --    No data found.  Updated Vital Signs BP 118/83 (BP Location: Left Arm)   Pulse 64   Temp 98.2 F (36.8 C) (Oral)   Resp 16   SpO2 98%    Physical Exam Constitutional:      Appearance: He is well-developed.  Cardiovascular:     Rate and Rhythm: Normal rate.  Pulmonary:     Effort: Pulmonary effort is normal.  Abdominal:     Tenderness: There is no abdominal tenderness.  Skin:    General: Skin is warm and dry.  Neurological:     Mental Status: He is alert and oriented to person, place, and  time.      UC Treatments / Results  Labs (all labs ordered are listed, but only abnormal results are displayed) Labs Reviewed  CBC - Abnormal; Notable for the following components:      Result Value   WBC 11.3 (*)    Hemoglobin 12.8 (*)    HCT 36.6 (*)    RDW 16.7 (*)    Platelets 422 (*)    nRBC 1.0 (*)    All other components within normal limits  COMPREHENSIVE METABOLIC PANEL - Abnormal; Notable for the following components:   Glucose, Bld 106 (*)    AST 43 (*)    ALT 54 (*)    All other components within normal limits  LIPASE, BLOOD  POCT URINALYSIS DIP (DEVICE)    EKG   Radiology No results found.  Procedures Procedures (including critical care time)  Medications Ordered in UC Medications - No data to display  Initial Impression / Assessment and Plan / UC Course  I have reviewed the triage vital signs and the nursing notes.  Pertinent labs & imaging results that were available during my care of the patient were reviewed by me and considered in my medical decision making (see chart for details).     Exam is overall reassuring, no  active or reproducible pain currently. Experiences abdominal pain with eating. pepcid has helped in the past. Encouraged restart of this. Baseline labs obtained as in the er lft's were not evaluated, gallstones also considered, pancreatitis in differential as well due to LUQ pain more recently.    On review today normal lipase, with slight elevation in wbc at 11 as well as very minimal elevation in LFTs. Non emergent findings but do recommend following up with pcp for recheck in the next month. Return precautions provided. Patient verbalized understanding and agreeable to plan.   Final Clinical Impressions(s) / UC Diagnoses   Final diagnoses:  Gastroesophageal reflux disease, unspecified whether esophagitis present     Discharge Instructions     I do think your symptoms are consistent with your reflux.  Please restart your pepcid or daily omeprazole.  I am checking to ensure no obvious issues with your gallbladder as well, I will call you if there are any concerning lab findings.  If normal you will not get a call from me.  Please follow up with a primary care provider as you may need further evaluation such as h.pylori testing.  See provided diet information to help manage your symptoms.  If significantly worsening of symptoms- pain, fevers, vomiting- please go to the ER.    ED Prescriptions    Medication Sig Dispense Auth. Provider   omeprazole (PRILOSEC) 20 MG capsule Take 1 capsule (20 mg total) by mouth daily. 30 capsule Georgetta Haber, NP     PDMP not reviewed this encounter.   Georgetta Haber, NP 08/29/19 2118

## 2019-08-29 NOTE — ED Triage Notes (Signed)
Pt c/o left rib/flank pain for approx 1 week. States pain most often occurs after meals accompanied with nausea.  Pt states was diagnosed with GERD after right side pain and states those symptoms have gotten better since taking Rx.   Also reports some dysuria sx for approx 1 week, chills.

## 2019-08-29 NOTE — Discharge Instructions (Signed)
I do think your symptoms are consistent with your reflux.  Please restart your pepcid or daily omeprazole.  I am checking to ensure no obvious issues with your gallbladder as well, I will call you if there are any concerning lab findings.  If normal you will not get a call from me.  Please follow up with a primary care provider as you may need further evaluation such as h.pylori testing.  See provided diet information to help manage your symptoms.  If significantly worsening of symptoms- pain, fevers, vomiting- please go to the ER.

## 2019-09-01 ENCOUNTER — Ambulatory Visit (HOSPITAL_COMMUNITY)
Admission: EM | Admit: 2019-09-01 | Discharge: 2019-09-01 | Disposition: A | Discharge status: Home / Self Care | Payer: Self-pay | Attending: Family Medicine | Admitting: Family Medicine

## 2019-09-01 ENCOUNTER — Other Ambulatory Visit: Payer: Self-pay

## 2019-09-01 ENCOUNTER — Encounter (HOSPITAL_COMMUNITY): Payer: Self-pay

## 2019-09-01 DIAGNOSIS — R1013 Epigastric pain: Secondary | ICD-10-CM

## 2019-09-01 DIAGNOSIS — K219 Gastro-esophageal reflux disease without esophagitis: Secondary | ICD-10-CM

## 2019-09-01 LAB — POCT URINALYSIS DIP (DEVICE)
Bilirubin Urine: NEGATIVE
Glucose, UA: NEGATIVE mg/dL
Hgb urine dipstick: NEGATIVE
Ketones, ur: NEGATIVE mg/dL
Leukocytes,Ua: NEGATIVE
Nitrite: NEGATIVE
Protein, ur: NEGATIVE mg/dL
Specific Gravity, Urine: 1.01 (ref 1.005–1.030)
Urobilinogen, UA: 0.2 mg/dL (ref 0.0–1.0)
pH: 6.5 (ref 5.0–8.0)

## 2019-09-01 NOTE — Discharge Instructions (Addendum)
You are experiencing GERD flare up. You may continue the omeprazole, you may also add protonix 40mg  daily.  You may need to start some miralax to help with constipation.  Please follow up if symptoms/pain are not improving.   Go to the Emergency Room for shortness of breath, severe diarrhea, and other concerning symptoms.

## 2019-09-01 NOTE — ED Triage Notes (Signed)
Pt state he has constipation x 2 days. Pt states he needs amoxicillin pt states that help him in the past with the issue of acid reflux and abdominal pain.

## 2019-09-01 NOTE — ED Provider Notes (Signed)
[place]    CSN: [id number] Arrival date & time: 09/01/19  1500      History   Chief Complaint Chief Complaint  Patient presents with  . Gastroesophageal Reflux  . Abdominal Pain    HPI Robert Reid is a 39 y.o. male.   Reports that he is still having abdominal pain, even after taking omeprazole for 4 days.  Requesting a test for H. pylori, and has not been able to get into primary care yet.  Reports that he thinks that he is getting constipated with the omeprazole.  Denies headache, fever, shortness of breath, vomiting, diarrhea muscle aches, rash, other symptoms.  ROS per HPI  The history is provided by the patient.    Past Medical History:  Diagnosis Date  . GERD (gastroesophageal reflux disease)   . Vertigo     There are no problems to display for this patient.   History reviewed. No pertinent surgical history.     Home Medications    Prior to Admission medications   Medication Sig Start Date End Date Taking? Authorizing Provider  omeprazole (PRILOSEC) 20 MG capsule Take 1 capsule (20 mg total) by mouth daily. 08/29/19   Zigmund Gottron, NP  ondansetron (ZOFRAN-ODT) 8 MG disintegrating tablet ondansetron odt 8 mg tbdp    [provider]    Family History Family History  Problem Relation Age of Onset  . Cancer Mother        colon    Social History Social History   Tobacco Use  . Smoking status: Never Smoker  . Smokeless tobacco: Never Used  Substance Use Topics  . Alcohol use: No  . Drug use: No     Allergies   Patient has no known allergies.   Review of Systems Review of Systems   Physical Exam Triage Vital Signs ED Triage Vitals  Enc Vitals Group     BP      Pulse      Resp      Temp      Temp src      SpO2      Weight      Height      Head Circumference      Peak Flow      Pain Score      Pain Loc      Pain Edu?      Excl. in [place]?    No data found.  Updated Vital Signs BP 130/78 (BP  Location: Right Arm)   Pulse 66   Temp 98.6 F (37 C) (Oral)   Resp 16   Wt 164 lb 6.4 oz (74.6 kg)   SpO2 98%   BMI 25.75 kg/m   Visual Acuity Right Eye Distance:   Left Eye Distance:   Bilateral Distance:    Right Eye Near:   Left Eye Near:    Bilateral Near:     Physical Exam Vitals and nursing note reviewed.  Constitutional:      General: He is not in acute distress.    Appearance: He is well-developed and normal weight.  HENT:     Head: Normocephalic and atraumatic.  Eyes:     Conjunctiva/sclera: Conjunctivae normal.  Cardiovascular:     Rate and Rhythm: Normal rate and regular rhythm.     Heart sounds: Normal heart sounds. No murmur.  Pulmonary:     Effort: Pulmonary effort is normal. No respiratory distress.     Breath sounds: Normal breath sounds.  No stridor. No wheezing, rhonchi or rales.  Abdominal:     General: Abdomen is flat. Bowel sounds are normal. There is no distension or abdominal bruit. There are no signs of injury.     Palpations: Abdomen is soft. There is no shifting dullness, fluid wave, hepatomegaly, splenomegaly, mass or pulsatile mass.     Tenderness: There is abdominal tenderness in the epigastric area.  Musculoskeletal:     Cervical back: Neck supple.  Skin:    General: Skin is warm and dry.     Capillary Refill: Capillary refill takes less than 2 seconds.  Neurological:     General: No focal deficit present.     Mental Status: He is alert and oriented to person, place, and time.  Psychiatric:        Mood and Affect: Mood normal.        Behavior: Behavior normal.      UC Treatments / Results  Labs (all labs ordered are listed, but only abnormal results are displayed) Labs Reviewed  POCT URINALYSIS DIP (DEVICE)    EKG   Radiology No results found.  Procedures Procedures (including critical care time)  Medications Ordered in UC Medications - No data to display  Initial Impression / Assessment and Plan / UC Course  I  have reviewed the triage vital signs and the nursing notes.  Pertinent labs & imaging results that were available during my care of the patient were reviewed by me and considered in my medical decision making (see chart for details).     Mild epigastric tenderness, has been taking omeprazole x4 days.  Instructed to continue omeprazole, may use MiraLAX as needed for constipation.  Ambulatory referral to [facility] today. Follow up with them as needed.  Final Clinical Impressions(s) / UC Diagnoses   Final diagnoses:  Epigastric pain  Gastroesophageal reflux disease without esophagitis     Discharge Instructions     You are experiencing GERD flare up. You may continue the omeprazole, you may also add protonix 40mg  daily.  You may need to start some miralax to help with constipation.  Please follow up if symptoms/pain are not improving.   Go to the Emergency Room for shortness of breath, severe diarrhea, and other concerning symptoms.    ED Prescriptions    None     I have reviewed the PDMP during this encounter.   , NP 09/01/19 1649

## 2019-12-09 ENCOUNTER — Ambulatory Visit: Payer: Self-pay | Attending: Internal Medicine

## 2019-12-09 DIAGNOSIS — Z23 Encounter for immunization: Secondary | ICD-10-CM

## 2019-12-29 NOTE — Progress Notes (Signed)
° °  Covid-19 Vaccination Clinic  Name:  Robert Reid    MRN: [id number] DOB: 1980-11-08  12/29/2019  [name] was observed post Covid-19 immunization for 15 minutes without incident. He was provided with Vaccine Information Sheet and instruction to access the V-Safe system.   [name] was instructed to call 911 with any severe reactions post vaccine:  Difficulty breathing   Swelling of face and throat   A fast heartbeat   A bad rash all over body   Dizziness and weakness   Immunizations Administered    Name Date Dose VIS Date Route   Moderna COVID-19 Vaccine 12/09/2019  4:43 PM 0.5 mL 06/2019 Intramuscular   Manufacturer: Moderna   Lot: [id number]   NDC: 72820-601-56

## 2020-01-11 ENCOUNTER — Ambulatory Visit: Payer: Self-pay | Attending: Internal Medicine

## 2020-01-11 DIAGNOSIS — Z23 Encounter for immunization: Secondary | ICD-10-CM

## 2020-01-11 NOTE — Progress Notes (Signed)
   Covid-19 Vaccination Clinic  Name:  Breylan Lefevers    MRN: [id number] DOB: 1981-04-01  01/11/2020  Mr. Ramakrishnan was observed post Covid-19 immunization for 15 minutes without incident. He was provided with Vaccine Information Sheet and instruction to access the V-Safe system.   Mr. Visconti was instructed to call 911 with any severe reactions post vaccine: Marland Kitchen Difficulty breathing  . Swelling of face and throat  . A fast heartbeat  . A bad rash all over body  . Dizziness and weakness   Immunizations Administered    Name Date Dose VIS Date Route   Moderna COVID-19 Vaccine 01/11/2020  1:04 PM 0.5 mL 06/2019 Intramuscular   Manufacturer: Moderna   Lot: [id number]   NDC: 21624-469-50

## 2020-04-03 ENCOUNTER — Emergency Department (HOSPITAL_COMMUNITY): Payer: No Typology Code available for payment source

## 2020-04-03 ENCOUNTER — Emergency Department (HOSPITAL_COMMUNITY)
Admission: EM | Admit: 2020-04-03 | Discharge: 2020-04-03 | Disposition: A | Discharge status: Home / Self Care | Payer: No Typology Code available for payment source | Attending: Emergency Medicine | Admitting: Emergency Medicine

## 2020-04-03 ENCOUNTER — Encounter (HOSPITAL_COMMUNITY): Payer: Self-pay

## 2020-04-03 DIAGNOSIS — Z5321 Procedure and treatment not carried out due to patient leaving prior to being seen by health care provider: Secondary | ICD-10-CM | POA: Diagnosis not present

## 2020-04-03 DIAGNOSIS — M79602 Pain in left arm: Secondary | ICD-10-CM | POA: Insufficient documentation

## 2020-04-03 DIAGNOSIS — R079 Chest pain, unspecified: Secondary | ICD-10-CM | POA: Diagnosis not present

## 2020-04-03 LAB — CBC
HCT: 36.4 % — ABNORMAL LOW (ref 39.0–52.0)
Hemoglobin: 12.8 g/dL — ABNORMAL LOW (ref 13.0–17.0)
MCH: 28.8 pg (ref 26.0–34.0)
MCHC: 35.2 g/dL (ref 30.0–36.0)
MCV: 82 fL (ref 80.0–100.0)
Platelets: 304 10*3/uL (ref 150–400)
RBC: 4.44 MIL/uL (ref 4.22–5.81)
RDW: 14.8 % (ref 11.5–15.5)
WBC: 9.6 10*3/uL (ref 4.0–10.5)
nRBC: 0.5 % — ABNORMAL HIGH (ref 0.0–0.2)

## 2020-04-03 LAB — BASIC METABOLIC PANEL
Anion gap: 9 (ref 5–15)
BUN: 9 mg/dL (ref 6–20)
CO2: 23 mmol/L (ref 22–32)
Calcium: 9.6 mg/dL (ref 8.9–10.3)
Chloride: 105 mmol/L (ref 98–111)
Creatinine, Ser: 0.9 mg/dL (ref 0.61–1.24)
GFR calc Af Amer: >60 mL/min (ref >60)
GFR calc non Af Amer: >60 mL/min (ref >60)
Glucose, Bld: 98 mg/dL (ref 70–99)
Potassium: 4.1 mmol/L (ref 3.5–5.1)
Sodium: 137 mmol/L (ref 135–145)

## 2020-04-03 LAB — TROPONIN I (HIGH SENSITIVITY)
Troponin I (High Sensitivity): 9 ng/L (ref <18)
Troponin I (High Sensitivity): 9 ng/L (ref <18)

## 2020-04-03 NOTE — ED Notes (Signed)
Pt states that he is leaving due to wait time 

## 2020-04-03 NOTE — ED Triage Notes (Signed)
Pt states that he has been having central CP for the past few days with radiation to L arm, states he is under a lot of stress.

## 2020-10-13 ENCOUNTER — Emergency Department (HOSPITAL_COMMUNITY)
Admission: EM | Admit: 2020-10-13 | Discharge: 2020-10-13 | Disposition: A | Discharge status: Home / Self Care | Payer: Managed Care, Other (non HMO) | Attending: Emergency Medicine | Admitting: Emergency Medicine

## 2020-10-13 ENCOUNTER — Encounter (HOSPITAL_COMMUNITY): Payer: Self-pay | Admitting: Emergency Medicine

## 2020-10-13 ENCOUNTER — Emergency Department (HOSPITAL_COMMUNITY): Payer: Managed Care, Other (non HMO)

## 2020-10-13 ENCOUNTER — Other Ambulatory Visit: Payer: Self-pay

## 2020-10-13 DIAGNOSIS — R52 Pain, unspecified: Secondary | ICD-10-CM

## 2020-10-13 DIAGNOSIS — M25512 Pain in left shoulder: Secondary | ICD-10-CM | POA: Insufficient documentation

## 2020-10-13 LAB — BASIC METABOLIC PANEL
Anion gap: 8 (ref 5–15)
BUN: 17 mg/dL (ref 6–20)
CO2: 24 mmol/L (ref 22–32)
Calcium: 9.3 mg/dL (ref 8.9–10.3)
Chloride: 104 mmol/L (ref 98–111)
Creatinine, Ser: 0.89 mg/dL (ref 0.61–1.24)
GFR, Estimated: >60 mL/min (ref >60)
Glucose, Bld: 108 mg/dL — ABNORMAL HIGH (ref 70–99)
Potassium: 4.5 mmol/L (ref 3.5–5.1)
Sodium: 136 mmol/L (ref 135–145)

## 2020-10-13 LAB — CBC
HCT: 35.7 % — ABNORMAL LOW (ref 39.0–52.0)
Hemoglobin: 12.9 g/dL — ABNORMAL LOW (ref 13.0–17.0)
MCH: 30.4 pg (ref 26.0–34.0)
MCHC: 36.1 g/dL — ABNORMAL HIGH (ref 30.0–36.0)
MCV: 84.2 fL (ref 80.0–100.0)
Platelets: 335 10*3/uL (ref 150–400)
RBC: 4.24 MIL/uL (ref 4.22–5.81)
RDW: 15.9 % — ABNORMAL HIGH (ref 11.5–15.5)
WBC: 9.9 10*3/uL (ref 4.0–10.5)
nRBC: 0.6 % — ABNORMAL HIGH (ref 0.0–0.2)

## 2020-10-13 LAB — TROPONIN I (HIGH SENSITIVITY): Troponin I (High Sensitivity): 8 ng/L (ref <18)

## 2020-10-13 MED ORDER — MELATONIN 1 MG PO CAPS
1.0000 mg | ORAL_CAPSULE | Freq: Every evening | ORAL | 1 refills | Status: AC
Start: 2020-10-13 — End: 2020-11-12

## 2020-10-13 NOTE — ED Triage Notes (Signed)
Patient coming from home, complaint of right shoulder pain for a few days along with intermittent chest pain. Patient states pain is sharp stabbing pain.

## 2020-10-13 NOTE — ED Provider Notes (Signed)
[facility] EMERGENCY DEPARTMENT Provider Note   CSN: [id number] Arrival date & time: 10/13/20  1636     History Chief Complaint  Patient presents with  . Shoulder Pain  . Chest Pain    Robert Reid is a 40 y.o. male presenting to ED with left shoulder pain.  Patient reports he has had discomfort in the top of his left shoulder for the past 3 to 4 days.  He says it is worse with arm movement and overhead raise.  He has had similar pains in the past but usually go away.  This was more persistent.  He did take some aspirin and feels it is helped his pain.  He tries to avoid NSAIDs.  His pain is currently moderate intensity, throbbing, worse with arm movement, better with aspirin, and different than his prior episodes.  He denies any chest pressure.  Denies nausea, vomiting, diaphoresis.  He has been seen in the emergency department in the past for different kind of chest pressure, but he states this is different than that pain.  He denies any history of angina or chest pain with exertion.  He denies history of smoking, diabetes, hypertension.  He denies any significant family history of MI.  He has never had a cardiac work-up before.  He denies any other significant medical problems, states he takes no medications at baseline.  HPI     Past Medical History:  Diagnosis Date  . GERD (gastroesophageal reflux disease)   . Vertigo     There are no problems to display for this patient.   History reviewed. No pertinent surgical history.     Family History  Problem Relation Age of Onset  . Cancer Mother        colon    Social History   Tobacco Use  . Smoking status: Never Smoker  . Smokeless tobacco: Never Used  Substance Use Topics  . Alcohol use: No  . Drug use: No    Home Medications Prior to Admission medications   Medication Sig Start Date End Date Taking? Authorizing Provider  Melatonin 1 MG CAPS Take 1 capsule (1 mg total) by mouth at  bedtime for 30 doses. 10/13/20 11/12/20 Yes Darcia Lampi, Kermit Balo, MD  omeprazole (PRILOSEC) 20 MG capsule Take 1 capsule (20 mg total) by mouth daily. 08/29/19   Georgetta Haber, NP  ondansetron (ZOFRAN-ODT) 8 MG disintegrating tablet ondansetron odt 8 mg tbdp    [provider]    Allergies    Nsaids  Review of Systems   Review of Systems  Constitutional: Negative for chills and fever.  HENT: Negative for ear pain and sore throat.   Eyes: Negative for pain and visual disturbance.  Respiratory: Negative for cough and shortness of breath.   Cardiovascular: Negative for chest pain and palpitations.  Gastrointestinal: Negative for abdominal pain and vomiting.  Musculoskeletal: Positive for arthralgias and myalgias.  Skin: Negative for color change and rash.  Neurological: Negative for syncope, light-headedness and headaches.  All other systems reviewed and are negative.   Physical Exam Updated Vital Signs BP 113/82   Pulse (!) 56   Temp 98 F (36.7 C) (Oral)   Resp 19   Ht 5\' 6"  (1.676 m)   Wt 71.2 kg   SpO2 100%   BMI 25.34 kg/m   Physical Exam Constitutional:      General: He is not in acute distress. HENT:     Head: Normocephalic and atraumatic.  Eyes:  Conjunctiva/sclera: Conjunctivae normal.     Pupils: Pupils are equal, round, and reactive to light.  Cardiovascular:     Rate and Rhythm: Normal rate and regular rhythm.  Pulmonary:     Effort: Pulmonary effort is normal. No respiratory distress.  Abdominal:     General: There is no distension.     Tenderness: There is no abdominal tenderness.  Musculoskeletal:     Comments: Focal tenderness near the left acromium on exam, pain worse with left arm adduction and overhead raise, no significant warmth or swelling of the shoulder joint, full ROM of the shoulder joint  Skin:    General: Skin is warm and dry.  Neurological:     General: No focal deficit present.     Mental Status: He is alert. Mental status is  at baseline.  Psychiatric:        Mood and Affect: Mood normal.        Behavior: Behavior normal.     ED Results / Procedures / Treatments   Labs (all labs ordered are listed, but only abnormal results are displayed) Labs Reviewed  BASIC METABOLIC PANEL - Abnormal; Notable for the following components:      Result Value   Glucose, Bld 108 (*)    All other components within normal limits  CBC - Abnormal; Notable for the following components:   Hemoglobin 12.9 (*)    HCT 35.7 (*)    MCHC 36.1 (*)    RDW 15.9 (*)    nRBC 0.6 (*)    All other components within normal limits  TROPONIN I (HIGH SENSITIVITY)    EKG EKG Interpretation  Date/Time:  Friday October 13 2020 17:03:06 EDT Ventricular Rate:  51 PR Interval:  168 QRS Duration: 96 QT Interval:  412 QTC Calculation: 379 R Axis:   48 Text Interpretation: Sinus bradycardia RSR' or QR pattern in V1 suggests right ventricular conduction delay  T wave inversions in inferior leads seen on prior ECG in 2021 Confirmed by Alvester Chou [phone]) on 10/13/2020 5:16:34 PM   Radiology DG Chest 2 View  Result Date: 10/13/2020 CLINICAL DATA:  Chest pain EXAM: CHEST - 2 VIEW COMPARISON:  April 03, 2020 FINDINGS: Lungs are clear. Heart size and pulmonary vascularity are normal. No adenopathy. No pneumothorax. There is slight lower thoracic levoscoliosis. IMPRESSION: Lungs are clear.  Heart size normal.  No adenopathy. Electronically Signed   By: Bretta Bang III M.D.   On: 10/13/2020 18:07   DG Shoulder Left  Result Date: 10/13/2020 CLINICAL DATA:  Pain EXAM: LEFT SHOULDER - 2+ VIEW COMPARISON:  None. FINDINGS: Frontal, Y scapular, and axillary images were obtained. There is no fracture or dislocation. Joint spaces appear normal. No erosive change. Visualized left lung clear. IMPRESSION: No fracture or dislocation.  No evident arthropathy. Electronically Signed   By: Bretta Bang III M.D.   On: 10/13/2020 18:07     Procedures Procedures   Medications Ordered in ED Medications - No data to display  ED Course  I have reviewed the triage vital signs and the nursing notes.  Pertinent labs & imaging results that were available during my care of the patient were reviewed by me and considered in my medical decision making (see chart for details).  Patient is here with left shoulder pain.  Is extremely reproducible exam.  This more suggestive of bursitis of the shoulder.  Advised that he take Tylenol, over-the-counter muscle rubs as needed.  He is avoiding NSAIDs.  He does  have some nonspecific T wave inversions on his EKGs were seen on prior episodes.  He also has had lower left chest pressure in the past, although none recently.  Based on this, I would advise that he follow-up with a cardiologist.  Return precautions were given.  He is otherwise low risk with a heart score of 1.  Labs and EKGs reviewed.  EKG shows a normal sinus rhythm is noted, no ST elevations.  Troponin is 8 with 4 days of symptoms, less likely ACS.  X-ray shows no focal infiltrate or pneumothorax.  No broken bones in the shoulder.   I doubt PE, pneumothorax, or aortic dissection with his benign presentation.  Pulses equal bilaterally, BP under control and normal here.   Okay for discharge     Final Clinical Impression(s) / ED Diagnoses Final diagnoses:  Acute pain of left shoulder    Rx / DC Orders ED Discharge Orders         Ordered    Melatonin 1 MG CAPS  Nightly        10/13/20 1924           Terald Sleeper, MD 10/14/20 [phone]

## 2020-10-13 NOTE — ED Triage Notes (Signed)
Emergency Medicine Provider Triage Evaluation Note  Robert Reid , a 40 y.o. male  was evaluated in triage.  Pt complains of right shoulder pain and chest pain.  Chest pain is right sided, intermittent, lasts for 4-5 seconds, does not radiate, described as sharp.  Last episode of chest pain today at 1600.    Left shoulder pain began on Tuesday.  Pain progressively worsening.  Pain improved with ASA.    Review of Systems  Positive: Chest pain, left shoulder pain  Negative: Fever, chills, shob, cough, nausea, vomiting  Physical Exam  BP 119/72 (BP Location: Right Arm)   Pulse (!) 56   Temp 98.6 F (37 C) (Oral)   Resp 16   SpO2 100%  Gen:   Awake, no distress   HEENT:  Atraumatic  Resp:  Normal effort  Cardiac:  Normal rate  MSK:   Moves extremities without difficulty  Neuro:  Speech clear   Medical Decision Making  Medically screening exam initiated at 5:14 PM.  Appropriate orders placed.  Robert Reid was informed that the remainder of the evaluation will be completed by another provider, this initial triage assessment does not replace that evaluation, and the importance of remaining in the ED until their evaluation is complete.  Clinical Impression   Chest pain and left shoulder pain. The patient appears stable so that the remainder of the work up may be completed by another provider.      Haskel Schroeder, [place] 10/13/20 1719

## 2020-10-13 NOTE — Discharge Instructions (Addendum)
I believe your pain today is coming from inflammation in your left shoulder.  You can take tylenol 650 mg every 6 hours for pain, and use muscle rubs on your shoulder.  We talked about your insomnia. You can start taking melatonin 1 mg about 1-2 hours before bedtime.  If this doesn't help, you can increase the dose to 3 mg, and then to 5 mg if needed.  I prescribed this medicine, but you can also buy it over-the-counter at most pharmacies.  Please buy plain melatonin with no other herbs or supplements added.  Finally, we talked about your EKG and your old episodes of chest pain.  I recommended that you follow-up with a cardiologist for a screening exam for heart disease.  You are otherwise low-risk for heart disease.  Please call their office to schedule an appointment as an outpatient.

## 2022-10-10 ENCOUNTER — Other Ambulatory Visit: Payer: Self-pay

## 2022-10-10 ENCOUNTER — Encounter (HOSPITAL_COMMUNITY): Payer: Self-pay

## 2022-10-10 ENCOUNTER — Emergency Department (HOSPITAL_COMMUNITY)
Admission: EM | Admit: 2022-10-10 | Discharge: 2022-10-10 | Discharge status: Against Medical Advice | Payer: BC Managed Care – PPO | Attending: Emergency Medicine | Admitting: Emergency Medicine

## 2022-10-10 DIAGNOSIS — R350 Frequency of micturition: Secondary | ICD-10-CM | POA: Insufficient documentation

## 2022-10-10 DIAGNOSIS — Z5321 Procedure and treatment not carried out due to patient leaving prior to being seen by health care provider: Secondary | ICD-10-CM | POA: Diagnosis not present

## 2022-10-10 DIAGNOSIS — M546 Pain in thoracic spine: Secondary | ICD-10-CM | POA: Diagnosis not present

## 2022-10-10 DIAGNOSIS — R109 Unspecified abdominal pain: Secondary | ICD-10-CM | POA: Diagnosis present

## 2022-10-10 LAB — URINALYSIS, ROUTINE W REFLEX MICROSCOPIC
Bacteria, UA: NONE SEEN
Bilirubin Urine: NEGATIVE
Glucose, UA: NEGATIVE mg/dL
Hgb urine dipstick: NEGATIVE
Ketones, ur: 5 mg/dL — AB
Leukocytes,Ua: NEGATIVE
Nitrite: NEGATIVE
Protein, ur: NEGATIVE mg/dL
Specific Gravity, Urine: 1.003 — ABNORMAL LOW (ref 1.005–1.030)
pH: 7 (ref 5.0–8.0)

## 2022-10-10 LAB — BASIC METABOLIC PANEL
Anion gap: 10 (ref 5–15)
BUN: 13 mg/dL (ref 6–20)
CO2: 24 mmol/L (ref 22–32)
Calcium: 9.1 mg/dL (ref 8.9–10.3)
Chloride: 101 mmol/L (ref 98–111)
Creatinine, Ser: 0.85 mg/dL (ref 0.61–1.24)
GFR, Estimated: >60 mL/min (ref >60)
Glucose, Bld: 89 mg/dL (ref 70–99)
Potassium: 4 mmol/L (ref 3.5–5.1)
Sodium: 135 mmol/L (ref 135–145)

## 2022-10-10 LAB — CBC
HCT: 36.1 % — ABNORMAL LOW (ref 39.0–52.0)
Hemoglobin: 12.9 g/dL — ABNORMAL LOW (ref 13.0–17.0)
MCH: 29.3 pg (ref 26.0–34.0)
MCHC: 35.7 g/dL (ref 30.0–36.0)
MCV: 81.9 fL (ref 80.0–100.0)
Platelets: 311 10*3/uL (ref 150–400)
RBC: 4.41 MIL/uL (ref 4.22–5.81)
RDW: 15.2 % (ref 11.5–15.5)
WBC: 9.1 10*3/uL (ref 4.0–10.5)
nRBC: 0.5 % — ABNORMAL HIGH (ref 0.0–0.2)

## 2022-10-10 NOTE — ED Notes (Signed)
Pt refused vitals 

## 2022-10-10 NOTE — ED Triage Notes (Signed)
Pt c/o left flank pain and left upper back pain, frequent urination mostly at night and [id number]

## 2022-10-16 ENCOUNTER — Other Ambulatory Visit: Payer: Self-pay | Admitting: Internal Medicine

## 2022-10-16 DIAGNOSIS — R1013 Epigastric pain: Secondary | ICD-10-CM

## 2022-10-21 ENCOUNTER — Encounter: Payer: Self-pay | Admitting: Internal Medicine

## 2022-10-25 ENCOUNTER — Ambulatory Visit
Admission: RE | Admit: 2022-10-25 | Discharge: 2022-10-25 | Disposition: A | Discharge status: Home / Self Care | Payer: BC Managed Care – PPO | Source: Ambulatory Visit | Attending: Internal Medicine | Admitting: Internal Medicine

## 2022-10-25 DIAGNOSIS — R1013 Epigastric pain: Secondary | ICD-10-CM

## 2022-10-25 MED ORDER — IOPAMIDOL (ISOVUE-300) INJECTION 61%
100.0000 mL | Freq: Once | INTRAVENOUS | Status: AC | PRN
  Administered 2022-10-25: 100 mL via INTRAVENOUS

## 2022-11-07 ENCOUNTER — Other Ambulatory Visit: Payer: Self-pay | Admitting: Internal Medicine

## 2022-11-07 DIAGNOSIS — R161 Splenomegaly, not elsewhere classified: Secondary | ICD-10-CM

## 2022-11-13 ENCOUNTER — Encounter (HOSPITAL_COMMUNITY): Payer: Self-pay | Admitting: Emergency Medicine

## 2022-11-13 ENCOUNTER — Ambulatory Visit (HOSPITAL_COMMUNITY)
Admission: EM | Admit: 2022-11-13 | Discharge: 2022-11-13 | Disposition: A | Discharge status: Home / Self Care | Payer: BC Managed Care – PPO | Attending: Emergency Medicine | Admitting: Emergency Medicine

## 2022-11-13 DIAGNOSIS — K219 Gastro-esophageal reflux disease without esophagitis: Secondary | ICD-10-CM | POA: Diagnosis not present

## 2022-11-13 MED ORDER — PANTOPRAZOLE SODIUM 40 MG PO TBEC: 40.0000 mg | DELAYED_RELEASE_TABLET | Freq: Every day | ORAL | 0 refills | Status: AC

## 2022-11-13 NOTE — ED Triage Notes (Signed)
Pt having right upper back pains for since Saturday. Denies falls or injury. Causing trouble sleeping. Hasn't tried any thing to help with pain.  When eats having pain in GI tract, doesn't take medications for acid reflux.  Had CT scan recently and saw something on his spleen.

## 2022-11-13 NOTE — ED Provider Notes (Signed)
[facility]    CSN: [id number] Arrival date & time: 11/13/22  0809      History   Chief Complaint Chief Complaint  Patient presents with   Back Pain    HPI Robert Reid is a 42 y.o. male.  Several month history of abdominal/epigastric pain with eating. History of reflux. Does not take any medications.  No fever or chills. Tolerating fluids. No NVD His PCP ordered outpatient CT that was done 10/25/2022. No acute findings apart from small splenic mass, will have follow up imaging in 6 months  Reports follow up PCP appointment is may 20th   Also 4 day history of right upper back pain, actually resolved with taking zinc. Not bothering him just wanted to mention it today.  No fall, injury, or trauma Has not taken any medications for symptoms No bowel or bladder dysfunction. No urinary symptoms  Past Medical History:  Diagnosis Date   GERD (gastroesophageal reflux disease)    Vertigo     There are no problems to display for this patient.   History reviewed. No pertinent surgical history.     Home Medications    Prior to Admission medications   Medication Sig Start Date End Date Taking? Authorizing Provider  pantoprazole (PROTONIX) 40 MG tablet Take 1 tablet (40 mg total) by mouth daily for 14 days. 11/13/22 11/27/22 Yes Keahi Mccarney, Lurena Joiner, PA-C  ondansetron (ZOFRAN-ODT) 8 MG disintegrating tablet ondansetron odt 8 mg tbdp    [provider]    Family History Family History  Problem Relation Age of Onset   Cancer Mother        colon    Social History Social History   Tobacco Use   Smoking status: Never   Smokeless tobacco: Never  Substance Use Topics   Alcohol use: No   Drug use: No     Allergies   Nsaids   Review of Systems Review of Systems As per HPI  Physical Exam Triage Vital Signs ED Triage Vitals  Enc Vitals Group     BP 11/13/22 0839 119/71     Pulse Rate 11/13/22 0839 (!) 51     Resp 11/13/22 0839 16     Temp  11/13/22 0839 98.4 F (36.9 C)     Temp src --      SpO2 11/13/22 0839 98 %     Weight --      Height --      Head Circumference --      Peak Flow --      Pain Score 11/13/22 0837 2     Pain Loc --      Pain Edu? --      Excl. in GC? --    No data found.  Updated Vital Signs BP 119/71 (BP Location: Left Arm)   Pulse (!) 51   Temp 98.4 F (36.9 C)   Resp 16   SpO2 98%    Physical Exam Vitals and nursing note reviewed.  Constitutional:      General: He is not in acute distress.    Appearance: Normal appearance.  HENT:     Mouth/Throat:     Mouth: Mucous membranes are moist.     Pharynx: Oropharynx is clear. No posterior oropharyngeal erythema.  Eyes:     Conjunctiva/sclera: Conjunctivae normal.  Cardiovascular:     Rate and Rhythm: Normal rate and regular rhythm.     Heart sounds: Normal heart sounds.  Pulmonary:     Effort: Pulmonary  effort is normal.     Breath sounds: Normal breath sounds.  Abdominal:     General: Bowel sounds are normal.     Palpations: Abdomen is soft.     Tenderness: There is no abdominal tenderness. There is no right CVA tenderness, left CVA tenderness, guarding or rebound.  Musculoskeletal:        General: No tenderness. Normal range of motion.     Cervical back: Normal range of motion.     Comments: No spinal tenderness  Skin:    General: Skin is warm and dry.  Neurological:     Mental Status: He is alert and oriented to person, place, and time.      UC Treatments / Results  Labs (all labs ordered are listed, but only abnormal results are displayed) Labs Reviewed - No data to display  EKG   Radiology No results found.  Procedures Procedures (including critical care time)  Medications Ordered in UC Medications - No data to display  Initial Impression / Assessment and Plan / UC Course  I have reviewed the triage vital signs and the nursing notes.  Pertinent labs & imaging results that were available during my care of  the patient were reviewed by me and considered in my medical decision making (see chart for details).  Reflux Recommend Protonix daily x 14 days He has follow up with PCP in 2 weeks, will talk with them about referral to GI and upper endo Strict return and ED precautions  Back pain Has overall improved with taking zinc No red flags. Back is non tender. Continue symptomatic care, can add tylenol if needed  Patient also reports mild constipation. Can try stool softener at home and increasing fluids  Final Clinical Impressions(s) / UC Diagnoses   Final diagnoses:  Gastroesophageal reflux disease, unspecified whether esophagitis present     Discharge Instructions      Please take the medication pantoprazole (Protonix) as prescribed. Take one tablet first thing every morning, on an empty stomach, for the next 14 days in a row.  Sit upright for 30 minutes after taking, and anytime after eating  Drink lots of water! Try a stool softener such as miralax if you are feeling constipated  Please follow with your primary care provider. If at any point your symptoms worsen or become severe, please go to the emergency department     ED Prescriptions     Medication Sig Dispense Auth. Provider   pantoprazole (PROTONIX) 40 MG tablet Take 1 tablet (40 mg total) by mouth daily for 14 days. 14 tablet Andy Allende, Lurena Joiner, PA-C      PDMP not reviewed this encounter.   Marlow Baars, [place] 11/13/22 [phone]

## 2022-11-13 NOTE — Discharge Instructions (Signed)
Please take the medication pantoprazole (Protonix) as prescribed. Take one tablet first thing every morning, on an empty stomach, for the next 14 days in a row.  Sit upright for 30 minutes after taking, and anytime after eating  Drink lots of water! Try a stool softener such as miralax if you are feeling constipated  Please follow with your primary care provider. If at any point your symptoms worsen or become severe, please go to the emergency department

## 2023-02-10 IMAGING — CR DG CHEST 2V
2 series · 2 of 2 positions shown · non-contrast
Comparison: April 03, 2020

CLINICAL DATA: Chest pain

EXAM:
CHEST - 2 VIEW

[chest pa]
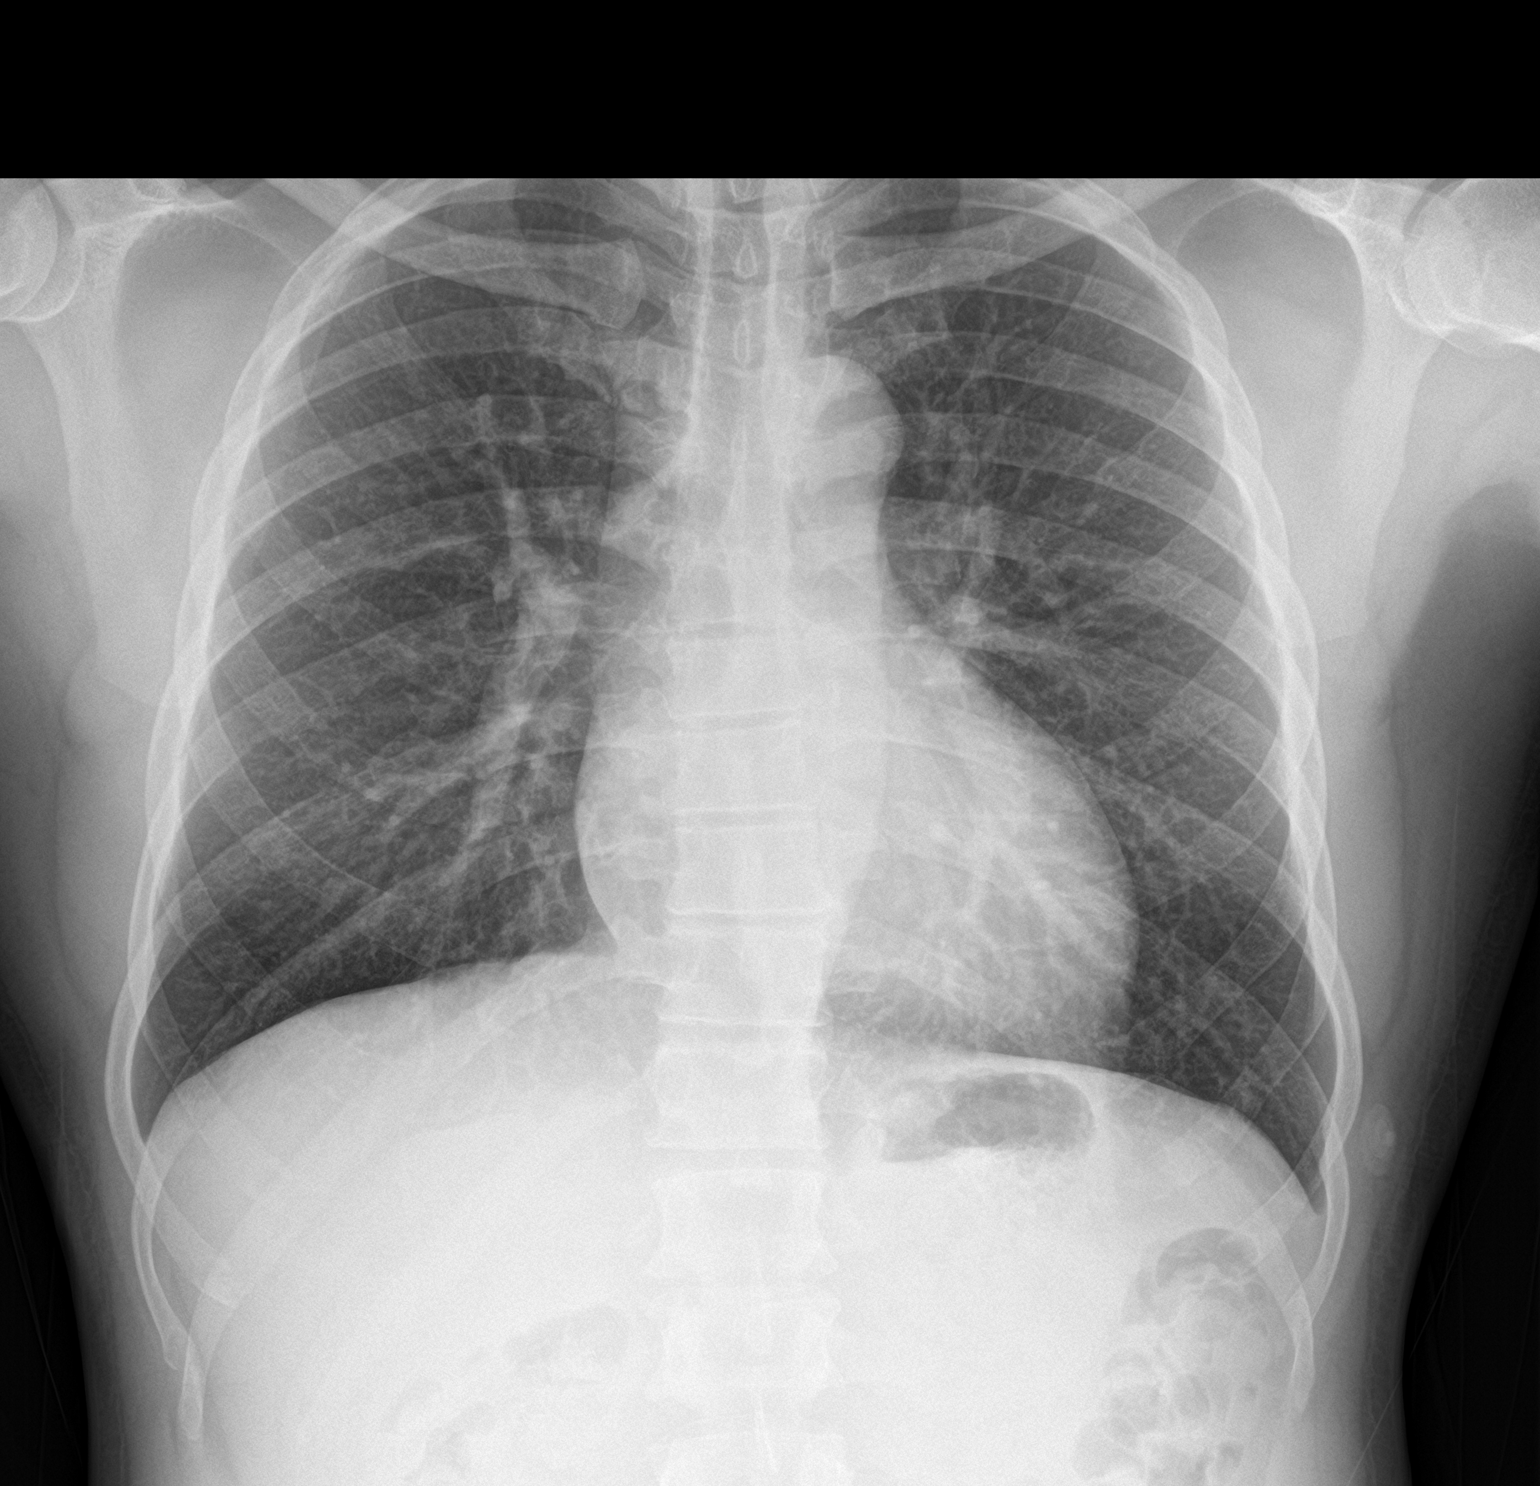

[chest lat]
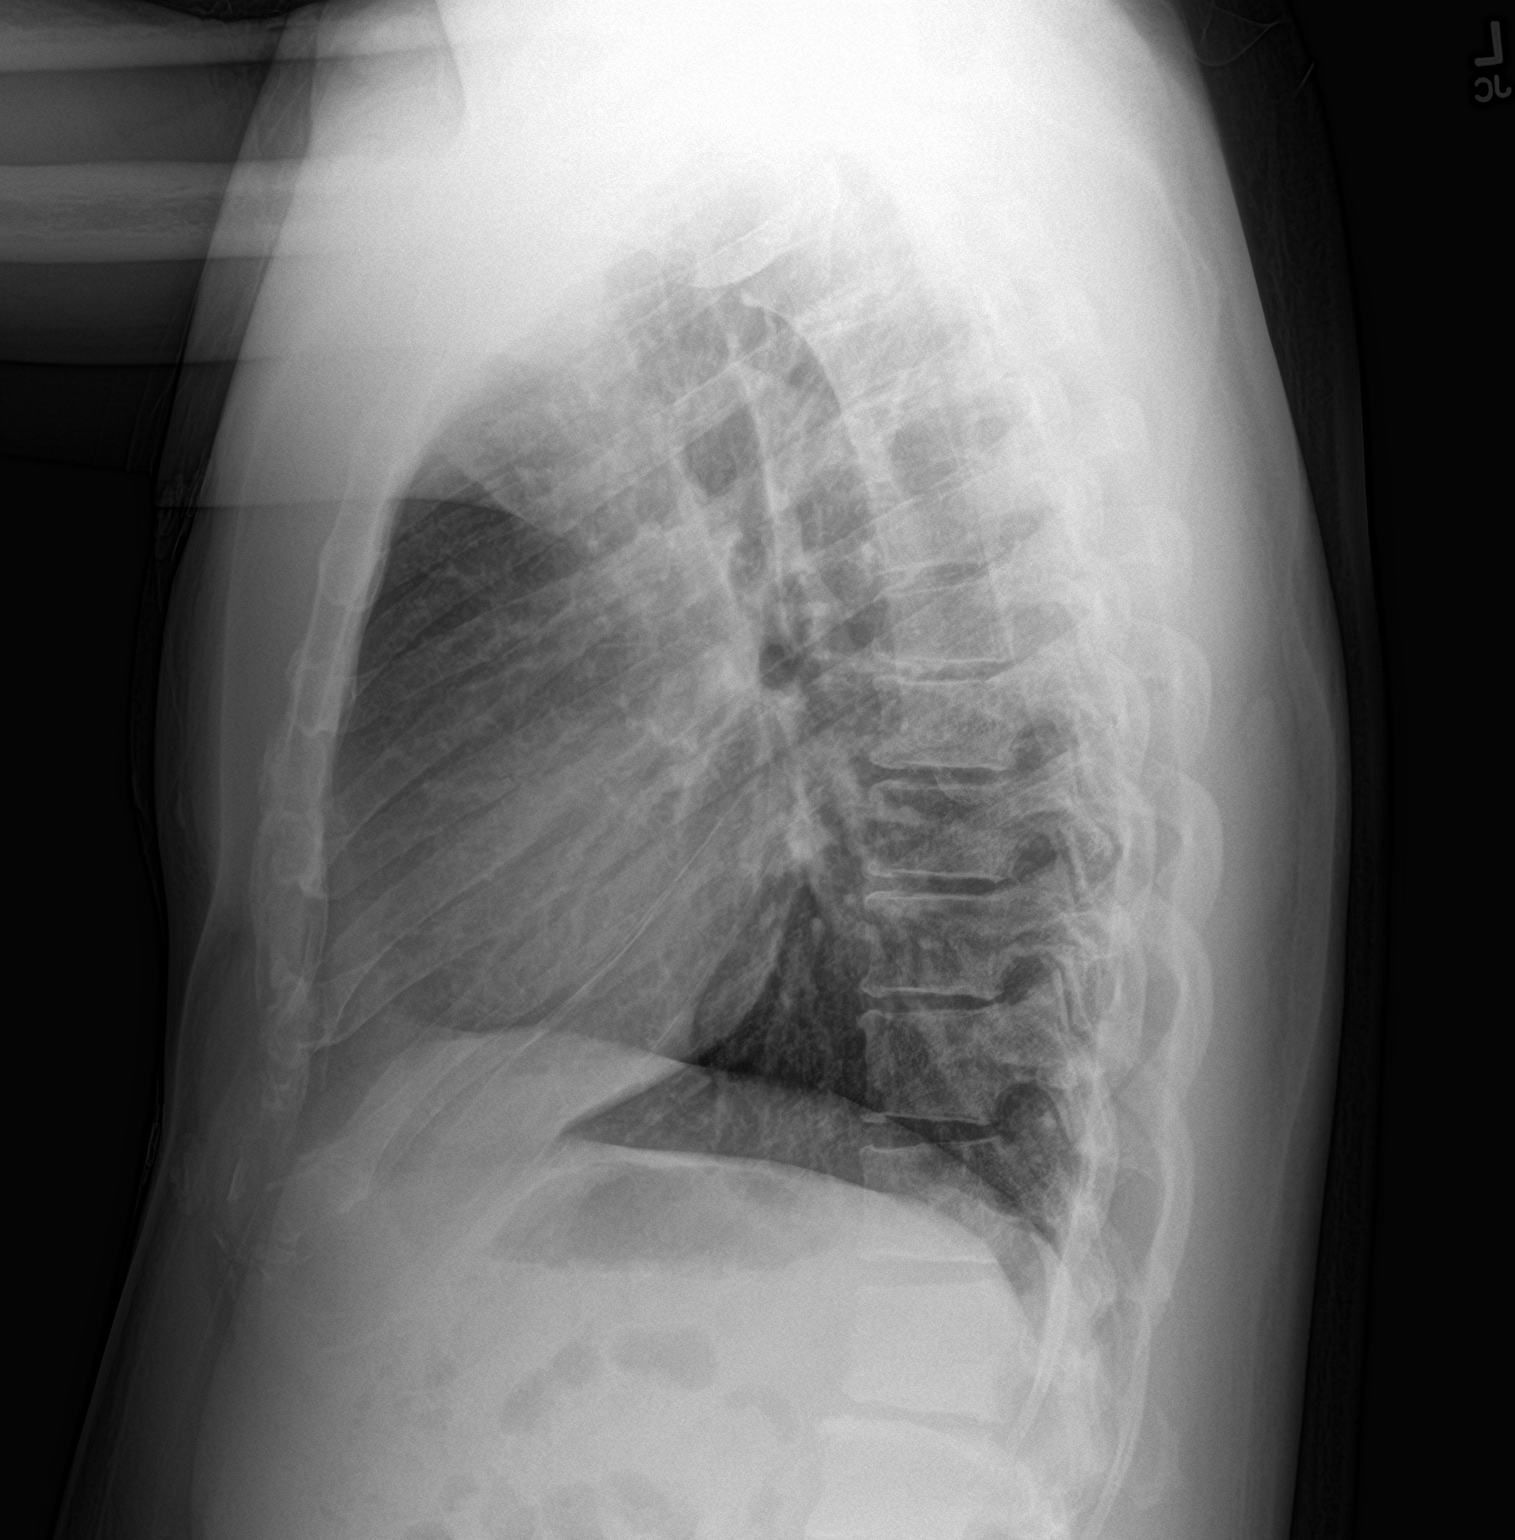

[2 of 2 positions shown; findings below may reference images not displayed]

FINDINGS: Lungs are clear. Heart size and pulmonary vascularity are normal. No
adenopathy. No pneumothorax. There is slight lower thoracic
levoscoliosis.
IMPRESSION: Lungs are clear.  Heart size normal.  No adenopathy.

## 2023-05-07 ENCOUNTER — Other Ambulatory Visit: Payer: BC Managed Care – PPO

## 2023-05-09 ENCOUNTER — Other Ambulatory Visit: Payer: Self-pay | Admitting: Internal Medicine

## 2023-05-09 DIAGNOSIS — R161 Splenomegaly, not elsewhere classified: Secondary | ICD-10-CM

## 2024-06-18 ENCOUNTER — Encounter (HOSPITAL_COMMUNITY): Payer: Self-pay | Admitting: Radiology

## 2024-06-18 ENCOUNTER — Other Ambulatory Visit: Payer: Self-pay

## 2024-06-18 DIAGNOSIS — R079 Chest pain, unspecified: Secondary | ICD-10-CM | POA: Diagnosis not present

## 2024-06-18 DIAGNOSIS — R5381 Other malaise: Secondary | ICD-10-CM | POA: Insufficient documentation

## 2024-06-18 DIAGNOSIS — R519 Headache, unspecified: Secondary | ICD-10-CM | POA: Diagnosis present

## 2024-06-18 DIAGNOSIS — G4700 Insomnia, unspecified: Secondary | ICD-10-CM | POA: Diagnosis not present

## 2024-06-18 NOTE — ED Triage Notes (Signed)
 Pt endorses that has not been sleeping well for the past year. Endorses that he has saw his primary care about it and they haven't done anything about it. He also endorses high BP last readying being 132/90. He endorses that for the past 4 days he has had chest pain that went to his arm and his leg and now his head is hurting. Pt with multiple other complaints.

## 2024-06-19 ENCOUNTER — Emergency Department (HOSPITAL_COMMUNITY): Payer: Self-pay

## 2024-06-19 ENCOUNTER — Encounter (HOSPITAL_COMMUNITY): Payer: Self-pay | Admitting: *Deleted

## 2024-06-19 ENCOUNTER — Emergency Department (HOSPITAL_COMMUNITY)
Admission: EM | Admit: 2024-06-19 | Discharge: 2024-06-19 | Disposition: A | Payer: Self-pay | Attending: Emergency Medicine | Admitting: Emergency Medicine

## 2024-06-19 DIAGNOSIS — R079 Chest pain, unspecified: Secondary | ICD-10-CM | POA: Insufficient documentation

## 2024-06-19 DIAGNOSIS — R519 Headache, unspecified: Secondary | ICD-10-CM | POA: Insufficient documentation

## 2024-06-19 DIAGNOSIS — G4700 Insomnia, unspecified: Secondary | ICD-10-CM | POA: Insufficient documentation

## 2024-06-19 LAB — BASIC METABOLIC PANEL WITH GFR
Anion gap: 4 — ABNORMAL LOW (ref 5–15)
BUN: 11 mg/dL (ref 6–20)
CO2: 29 mmol/L (ref 22–32)
Calcium: 8.9 mg/dL (ref 8.9–10.3)
Chloride: 105 mmol/L (ref 98–111)
Creatinine, Ser: 0.82 mg/dL (ref 0.61–1.24)
GFR, Estimated: >60 mL/min (ref >60)
Glucose, Bld: 104 mg/dL — ABNORMAL HIGH (ref 70–99)
Potassium: 4.3 mmol/L (ref 3.5–5.1)
Sodium: 138 mmol/L (ref 135–145)

## 2024-06-19 LAB — CBC
HCT: 34.8 % — ABNORMAL LOW (ref 39.0–52.0)
Hemoglobin: 12.3 g/dL — ABNORMAL LOW (ref 13.0–17.0)
MCH: 28.6 pg (ref 26.0–34.0)
MCHC: 35.3 g/dL (ref 30.0–36.0)
MCV: 80.9 fL (ref 80.0–100.0)
Platelets: 270 K/uL (ref 150–400)
RBC: 4.3 MIL/uL (ref 4.22–5.81)
RDW: 15.5 % (ref 11.5–15.5)
WBC: 8.8 K/uL (ref 4.0–10.5)
nRBC: 0.6 % — ABNORMAL HIGH (ref 0.0–0.2)

## 2024-06-19 LAB — TROPONIN I (HIGH SENSITIVITY)
Troponin I (High Sensitivity): 10 ng/L (ref <18)
Troponin I (High Sensitivity): 10 ng/L (ref <18)

## 2024-06-19 MED ORDER — KETOROLAC TROMETHAMINE 30 MG/ML IJ SOLN
15.0000 mg | Freq: Once | INTRAMUSCULAR | Status: AC
  Administered 2024-06-19: 15 mg via INTRAVENOUS
  Filled 2024-06-19: qty 1

## 2024-06-19 MED ORDER — DIPHENHYDRAMINE HCL 50 MG/ML IJ SOLN
25.0000 mg | Freq: Once | INTRAMUSCULAR | Status: AC
  Administered 2024-06-19: 25 mg via INTRAVENOUS
  Filled 2024-06-19: qty 1

## 2024-06-19 MED ORDER — PROCHLORPERAZINE EDISYLATE 10 MG/2ML IJ SOLN
10.0000 mg | Freq: Once | INTRAMUSCULAR | Status: AC
  Administered 2024-06-19: 10 mg via INTRAVENOUS
  Filled 2024-06-19: qty 2

## 2024-06-19 MED ORDER — TRAZODONE HCL 50 MG PO TABS: 50.0000 mg | ORAL_TABLET | Freq: Every day | ORAL | 0 refills | Status: AC

## 2024-06-19 NOTE — ED Provider Notes (Signed)
 [facility] EMERGENCY DEPARTMENT AT [facility] Provider Note   CSN: [id number] Arrival date & time: 06/18/24  2334     Patient presents with: Headache, Insomnia, and Chest Pain   Robert Reid is a 43 y.o. male.   Presents to the emergency department with multiple complaints.  Patient reports that he has not been able to sleep well for the past year.  For the last 4 days he does not think he has been able to sleep at all.  He saw his primary care for this but was not given any medications.  He has not been feeling well progressively over the last few days.  He reports generalized headache and malaise.  He thinks his blood pressure has been elevated.  He reports that he has been having intermittent chest pain today that radiates to the arm and leg.       Prior to Admission medications  Medication Sig Start Date End Date Taking? Authorizing Provider  traZODone  (DESYREL ) 50 MG tablet Take 1 tablet (50 mg total) by mouth at bedtime. 06/19/24  Yes Arfa Lamarca, Lonni PARAS, MD  ondansetron (ZOFRAN-ODT) 8 MG disintegrating tablet ondansetron odt 8 mg tbdp    [provider]  pantoprazole  (PROTONIX ) 40 MG tablet Take 1 tablet (40 mg total) by mouth daily for 14 days. 11/13/22 11/27/22  Rising, [place], PA-C    Allergies: Nsaids    Review of Systems  Updated Vital Signs BP 114/82   Pulse (!) 56   Temp 97.6 F (36.4 C) (Oral)   Resp 15   Ht 5' 6 (1.676 m)   Wt 70.8 kg   SpO2 100%   BMI 25.19 kg/m   Physical Exam Vitals and nursing note reviewed.  Constitutional:      General: He is not in acute distress.    Appearance: He is well-developed.  HENT:     Head: Normocephalic and atraumatic.     Mouth/Throat:     Mouth: Mucous membranes are moist.  Eyes:     General: Vision grossly intact. Gaze aligned appropriately.     Extraocular Movements: Extraocular movements intact.     Conjunctiva/sclera: Conjunctivae normal.  Cardiovascular:     Rate and Rhythm:  Normal rate and regular rhythm.     Pulses: Normal pulses.     Heart sounds: Normal heart sounds, S1 normal and S2 normal. No murmur heard.    No friction rub. No gallop.  Pulmonary:     Effort: Pulmonary effort is normal. No respiratory distress.     Breath sounds: Normal breath sounds.  Abdominal:     Palpations: Abdomen is soft.     Tenderness: There is no abdominal tenderness. There is no guarding or rebound.     Hernia: No hernia is present.  Musculoskeletal:        General: No swelling.     Cervical back: Full passive range of motion without pain, normal range of motion and neck supple. No pain with movement, spinous process tenderness or muscular tenderness. Normal range of motion.     Right lower leg: No edema.     Left lower leg: No edema.  Skin:    General: Skin is warm and dry.     Capillary Refill: Capillary refill takes less than 2 seconds.     Findings: No ecchymosis, erythema, lesion or wound.  Neurological:     Mental Status: He is alert and oriented to person, place, and time.     GCS: GCS eye  subscore is 4. GCS verbal subscore is 5. GCS motor subscore is 6.     Cranial Nerves: Cranial nerves 2-12 are intact.     Sensory: Sensation is intact.     Motor: Motor function is intact. No weakness or abnormal muscle tone.     Coordination: Coordination is intact.  Psychiatric:        Mood and Affect: Mood normal.        Speech: Speech normal.        Behavior: Behavior normal.     (all labs ordered are listed, but only abnormal results are displayed) Labs Reviewed  BASIC METABOLIC PANEL WITH GFR - Abnormal; Notable for the following components:      Result Value   Glucose, Bld 104 (*)    Anion gap 4 (*)    All other components within normal limits  CBC - Abnormal; Notable for the following components:   Hemoglobin 12.3 (*)    HCT 34.8 (*)    nRBC 0.6 (*)    All other components within normal limits  TROPONIN I (HIGH SENSITIVITY)  TROPONIN I (HIGH SENSITIVITY)     EKG: EKG Interpretation Date/Time:  Saturday June 19 2024 03:36:41 EST Ventricular Rate:  62 PR Interval:  187 QRS Duration:  94 QT Interval:  406 QTC Calculation: 413 R Axis:   14  Text Interpretation: Sinus rhythm Anterior infarct, old Nonspecific repol abnormality, inferior leads Borderline ST elevation, lateral leads Confirmed by Haze Lonni PARAS [phone]) on 06/19/2024 3:51:01 AM  Radiology: CT HEAD WO CONTRAST ( ) Result Date: 06/19/2024 EXAM: CT HEAD WITHOUT 06/19/2024 04:19:01 AM TECHNIQUE: CT of the head was performed without the administration of intravenous contrast. Automated exposure control, iterative reconstruction, and/or weight based adjustment of the mA/kV was utilized to reduce the radiation dose to as low as reasonably achievable. COMPARISON: None available. CLINICAL HISTORY: 43 year old male with headache, increasing frequency or severity. FINDINGS: BRAIN AND VENTRICLES: Normal brain volume. Normal gray white differentiation. No suspicious intracranial vascular hyperdensity. No acute intracranial hemorrhage. No mass effect or midline shift. No extra-axial fluid collection. No evidence of acute infarct. No hydrocephalus. ORBITS: No acute abnormality. SINUSES AND MASTOIDS: Minor paranasal sinus mucosal thickening. No sinus fluid levels and Significance is doubtful. Tympanic cavities and mastoids are well aerated; minor bilateral mastoid air cell fluid also of doubtful significance. SOFT TISSUES AND SKULL: Benign right posterior occipital convexity scalp lipoma measures 10 x 26 x 17 mm, appears simple and benign. No acute skull fracture. No acute soft tissue abnormality. IMPRESSION: 1. Normal non-contrast CT appearance of the brain. 2. Minor paranasal sinus inflammation, of doubtful significance. 3. Benign right posterior occipital convexity scalp lipoma (10 x 26 x 17 mm). Electronically signed by: Helayne Hurst MD 06/19/2024 04:30 AM EST RP Workstation: [id number]      Procedures   Medications Ordered in the ED  ketorolac  (TORADOL ) 30 MG/ML injection 15 mg (has no administration in time range)  prochlorperazine  (COMPAZINE ) injection 10 mg (has no administration in time range)  diphenhydrAMINE  (BENADRYL ) injection 25 mg (has no administration in time range)                                    Medical Decision Making Amount and/or Complexity of Data Reviewed Radiology: ordered.   Differential Diagnosis considered includes, but not limited to: Hypertensive emergencies, Idiopathic intracranial hypertension, Space occupying lesions (tumors, abscesses, cysts), Acute hydrocephalus, Dural sinus thrombosis,  Intracranial hemorrhage, Cerebrovascular accident or stroke, Meningitis and encephalitis, Migraine HA, Tension HA.  Differential Diagnosis considered includes, but not limited to: STEMI; NSTEMI; myocarditis; pericarditis; pulmonary embolism; aortic dissection; pneumothorax; pneumonia; gastritis; musculoskeletal pain  Presents with multiple complaints.  Patient with headache that is likely secondary to lack of sleep.  He underwent CT head that does not show any acute abnormality.  He has a normal, nonfocal neurologic exam.  No red flags for subarachnoid hemorrhage.  Treated with migraine cocktail.  Patient complaining of intermittent chest pain.  EKG shows no acute changes.  Troponins are negative x 2.  Symptoms are atypical for cardiac etiology.  No further emergent workup necessary for atypical chest pain.  Remainder of patient's labs are unremarkable.  Reassured, given limited supply of trazodone  to use for insomnia, needs to follow-up with primary care.       Final diagnoses:  Bad headache  Chest pain, unspecified type  Insomnia, unspecified type    ED Discharge Orders          Ordered    traZODone  (DESYREL ) 50 MG tablet  Daily at bedtime        06/19/24 0517               Haze Lonni PARAS, MD 06/19/24 [phone]
